# Patient Record
Sex: Female | Born: 1945
Health system: Southern US, Community
[De-identification: ages and names within clinical notes are randomized; demographics above are authoritative.]

## PROBLEM LIST (undated history)

## (undated) DIAGNOSIS — J302 Other seasonal allergic rhinitis: Secondary | ICD-10-CM

## (undated) DIAGNOSIS — F0390 Unspecified dementia without behavioral disturbance: Secondary | ICD-10-CM

## (undated) DIAGNOSIS — M1990 Unspecified osteoarthritis, unspecified site: Secondary | ICD-10-CM

## (undated) DIAGNOSIS — I10 Essential (primary) hypertension: Secondary | ICD-10-CM

## (undated) DIAGNOSIS — Z780 Asymptomatic menopausal state: Secondary | ICD-10-CM

## (undated) HISTORY — DX: Asymptomatic menopausal state: Z78.0

## (undated) HISTORY — PX: PILONIDAL CYST EXCISION: SHX744

---

## 1999-01-25 ENCOUNTER — Other Ambulatory Visit: Admission: RE | Admit: 1999-01-25 | Discharge: 1999-01-25 | Payer: Self-pay | Admitting: *Deleted

## 1999-05-31 ENCOUNTER — Other Ambulatory Visit: Admission: RE | Admit: 1999-05-31 | Discharge: 1999-05-31 | Payer: Self-pay | Admitting: *Deleted

## 1999-12-19 ENCOUNTER — Other Ambulatory Visit: Admission: RE | Admit: 1999-12-19 | Discharge: 1999-12-19 | Payer: Self-pay | Admitting: *Deleted

## 2000-05-15 ENCOUNTER — Other Ambulatory Visit: Admission: RE | Admit: 2000-05-15 | Discharge: 2000-05-15 | Payer: Self-pay | Admitting: *Deleted

## 2000-12-03 ENCOUNTER — Other Ambulatory Visit: Admission: RE | Admit: 2000-12-03 | Discharge: 2000-12-03 | Payer: Self-pay | Admitting: *Deleted

## 2001-08-19 ENCOUNTER — Other Ambulatory Visit: Admission: RE | Admit: 2001-08-19 | Discharge: 2001-08-19 | Payer: Self-pay | Admitting: *Deleted

## 2003-02-21 ENCOUNTER — Other Ambulatory Visit: Admission: RE | Admit: 2003-02-21 | Discharge: 2003-02-21 | Payer: Self-pay | Admitting: Obstetrics and Gynecology

## 2003-03-01 ENCOUNTER — Other Ambulatory Visit: Admission: RE | Admit: 2003-03-01 | Discharge: 2003-03-01 | Payer: Self-pay | Admitting: Obstetrics and Gynecology

## 2003-04-21 ENCOUNTER — Ambulatory Visit (HOSPITAL_COMMUNITY): Admission: RE | Admit: 2003-04-21 | Discharge: 2003-04-21 | Payer: Self-pay | Admitting: Obstetrics and Gynecology

## 2003-04-21 ENCOUNTER — Encounter (INDEPENDENT_AMBULATORY_CARE_PROVIDER_SITE_OTHER): Payer: Self-pay

## 2004-06-06 ENCOUNTER — Other Ambulatory Visit: Admission: RE | Admit: 2004-06-06 | Discharge: 2004-06-06 | Payer: Self-pay | Admitting: *Deleted

## 2005-01-23 ENCOUNTER — Encounter: Admission: RE | Admit: 2005-01-23 | Discharge: 2005-01-23 | Payer: Self-pay | Admitting: Family Medicine

## 2007-01-11 ENCOUNTER — Other Ambulatory Visit: Admission: RE | Admit: 2007-01-11 | Discharge: 2007-01-11 | Payer: Self-pay | Admitting: Obstetrics & Gynecology

## 2007-11-25 HISTORY — PX: BLEPHAROPLASTY: SUR158

## 2009-07-25 HISTORY — PX: JOINT REPLACEMENT: SHX530

## 2009-08-03 ENCOUNTER — Inpatient Hospital Stay (HOSPITAL_COMMUNITY): Admission: RE | Admit: 2009-08-03 | Discharge: 2009-08-06 | Payer: Self-pay | Admitting: Orthopedic Surgery

## 2010-09-30 LAB — HM DEXA SCAN

## 2011-02-28 LAB — CBC
HCT: 30.5 % — ABNORMAL LOW (ref 36.0–46.0)
HCT: 41.2 % (ref 36.0–46.0)
Hemoglobin: 10.2 g/dL — ABNORMAL LOW (ref 12.0–15.0)
Hemoglobin: 13.8 g/dL (ref 12.0–15.0)
Hemoglobin: 9.2 g/dL — ABNORMAL LOW (ref 12.0–15.0)
MCHC: 34.7 g/dL (ref 30.0–36.0)
MCV: 90.3 fL (ref 78.0–100.0)
MCV: 91.3 fL (ref 78.0–100.0)
Platelets: 133 10*3/uL — ABNORMAL LOW (ref 150–400)
Platelets: 139 10*3/uL — ABNORMAL LOW (ref 150–400)
RBC: 2.95 MIL/uL — ABNORMAL LOW (ref 3.87–5.11)
RBC: 3.1 MIL/uL — ABNORMAL LOW (ref 3.87–5.11)
RBC: 3.34 MIL/uL — ABNORMAL LOW (ref 3.87–5.11)
RDW: 13.8 % (ref 11.5–15.5)
WBC: 5.8 10*3/uL (ref 4.0–10.5)
WBC: 6.1 10*3/uL (ref 4.0–10.5)
WBC: 7.7 10*3/uL (ref 4.0–10.5)

## 2011-02-28 LAB — COMPREHENSIVE METABOLIC PANEL
ALT: 21 U/L (ref 0–35)
AST: 28 U/L (ref 0–37)
Alkaline Phosphatase: 52 U/L (ref 39–117)
BUN: 11 mg/dL (ref 6–23)
Calcium: 10 mg/dL (ref 8.4–10.5)
Chloride: 102 mEq/L (ref 96–112)
GFR calc Af Amer: >60 mL/min (ref >60)
GFR calc non Af Amer: >60 mL/min (ref >60)
Total Bilirubin: 0.4 mg/dL (ref 0.3–1.2)

## 2011-02-28 LAB — BASIC METABOLIC PANEL
BUN: 4 mg/dL — ABNORMAL LOW (ref 6–23)
Calcium: 8.4 mg/dL (ref 8.4–10.5)
Chloride: 103 mEq/L (ref 96–112)
Chloride: 104 mEq/L (ref 96–112)
GFR calc Af Amer: >60 mL/min (ref >60)
GFR calc Af Amer: >60 mL/min (ref >60)
GFR calc non Af Amer: >60 mL/min (ref >60)
Potassium: 3.9 mEq/L (ref 3.5–5.1)
Potassium: 3.9 mEq/L (ref 3.5–5.1)

## 2011-02-28 LAB — PROTIME-INR
INR: 0.9 (ref 0.00–1.49)
INR: 1.5 (ref 0.00–1.49)
Prothrombin Time: 19.6 seconds — ABNORMAL HIGH (ref 11.6–15.2)

## 2011-02-28 LAB — URINALYSIS, ROUTINE W REFLEX MICROSCOPIC
Bilirubin Urine: NEGATIVE
Hgb urine dipstick: NEGATIVE
Nitrite: NEGATIVE
Specific Gravity, Urine: 1.017 (ref 1.005–1.030)
Urobilinogen, UA: 0.2 mg/dL (ref 0.0–1.0)

## 2011-02-28 LAB — APTT: aPTT: 28 seconds (ref 24–37)

## 2011-02-28 LAB — TYPE AND SCREEN: Antibody Screen: NEGATIVE

## 2011-04-11 NOTE — Op Note (Signed)
NAME:  Kristina Carter, Kristina Carter                           ACCOUNT NO.:  [id number]   MEDICAL RECORD NO.:  [id number]                   PATIENT TYPE:  AMB   LOCATION:  SDC                                  FACILITY:  [facility]   PHYSICIAN:  Laqueta Linden, M.D.                 DATE OF BIRTH:  May 03, 1946   DATE OF PROCEDURE:  04/21/2003  DATE OF DISCHARGE:                                 OPERATIVE REPORT   PREOPERATIVE DIAGNOSIS:  Postmenopausal bleeding due to endometrial polyp.   POSTOPERATIVE DIAGNOSIS:  Postmenopausal bleeding due to endometrial polyp.   PROCEDURE:  Hysteroscopic resection with curettage.   SURGEON:  Laqueta Linden, M.D.   ANESTHESIA:  General LMA.   ESTIMATED BLOOD LOSS:  Less than 10 mL.   SORBITOL NET INTAKE:  Less than 100 mL.   COMPLICATIONS:  None.   INDICATIONS:  The patient is a 65 year old para 2 menopausal female who  presented with postmenopausal bleeding.  She underwent ultrasound with  sonohysterogram revealing a small fundal endometrial polyp as the source of  the bleeding.  She is therefore to undergo hysteroscopic evaluation with  resection.  She has seen the informed consent film, voiced her understanding  and acceptance of all risks, and agrees to proceed.   DESCRIPTION OF PROCEDURE:  The patient was taken to the operating room and  after proper identification and consents were ascertained, she was placed on  the operating table in the supine position.  After general LMA anesthesia  was induced, she was placed in the [place] stirrups and the perineum and  vagina were prepped and draped in a routine sterile fashion.  A  transurethral Foley was placed, which was removed at the conclusion of the  procedure.  Bimanual examination confirmed an anterior, mobile uterus.  A  speculum was placed in the vagina and the cervix was grasped with a single-  tooth tenaculum.  The uterine cavity sounded to 9 cm.  The internal os was  gently dilated to a #33 Pratt dilator.   The resectoscope with continuous  sorbitol infusion was then inserted.  Under direct vision using the video,  the endocervical canal was free of lesions.  The endometrial cavity appeared  atrophic and free of lesions with the exception of a 5-6 mm broad-based  polyp protruding from the left cornual region.  The right cornu was  visualized and there were no lesions.  The remainder of the cavity was  clean.  The resectoscope was placed on routine settings for the single loop,  and the polyp was then resected in several pieces.  Hemostasis was  excellent.  This specimen was sent separately.  There were no lesions above  where the polyp was in the cornual region.  The scope was then withdrawn.  Sharp curettage productive of a minimal amount of additional tissue was then  performed.  All instruments were then  removed.  The tenaculum site was  hemostatic.  There was no active bleeding from the cervix.  The patient  received Toradol 30 mg IV and 30 mg IM prior to awakening.  She was  awakened and stable on transfer to the recovery room.  She will be observed  and discharged per anesthesia protocol.  She is to follow up in the office  in two to three weeks' time or sooner for excessive pain, fever, bleeding,  or other concerns.  She is told to take Advil or Aleve as needed for any  cramping and to continue all her routine medications.                                               Laqueta Linden, M.D.    LKS/MEDQ  D:  04/21/2003  T:  04/21/2003  Job:  [id number]

## 2011-12-31 DIAGNOSIS — I10 Essential (primary) hypertension: Secondary | ICD-10-CM | POA: Diagnosis not present

## 2011-12-31 DIAGNOSIS — E7800 Pure hypercholesterolemia, unspecified: Secondary | ICD-10-CM | POA: Diagnosis not present

## 2012-08-24 DIAGNOSIS — M1710 Unilateral primary osteoarthritis, unspecified knee: Secondary | ICD-10-CM | POA: Diagnosis not present

## 2012-10-24 HISTORY — PX: BREAST SURGERY: SHX581

## 2012-11-05 DIAGNOSIS — Z1231 Encounter for screening mammogram for malignant neoplasm of breast: Secondary | ICD-10-CM | POA: Diagnosis not present

## 2012-11-10 DIAGNOSIS — N6489 Other specified disorders of breast: Secondary | ICD-10-CM | POA: Diagnosis not present

## 2012-11-10 DIAGNOSIS — N63 Unspecified lump in unspecified breast: Secondary | ICD-10-CM | POA: Diagnosis not present

## 2012-11-11 DIAGNOSIS — N6019 Diffuse cystic mastopathy of unspecified breast: Secondary | ICD-10-CM | POA: Diagnosis not present

## 2012-11-11 DIAGNOSIS — N63 Unspecified lump in unspecified breast: Secondary | ICD-10-CM | POA: Diagnosis not present

## 2012-11-11 DIAGNOSIS — Z0189 Encounter for other specified special examinations: Secondary | ICD-10-CM | POA: Diagnosis not present

## 2012-11-11 DIAGNOSIS — R928 Other abnormal and inconclusive findings on diagnostic imaging of breast: Secondary | ICD-10-CM | POA: Diagnosis not present

## 2012-12-01 DIAGNOSIS — Z23 Encounter for immunization: Secondary | ICD-10-CM | POA: Diagnosis not present

## 2012-12-24 DIAGNOSIS — M1710 Unilateral primary osteoarthritis, unspecified knee: Secondary | ICD-10-CM | POA: Diagnosis not present

## 2013-01-27 DIAGNOSIS — I10 Essential (primary) hypertension: Secondary | ICD-10-CM | POA: Diagnosis not present

## 2013-01-27 DIAGNOSIS — M1710 Unilateral primary osteoarthritis, unspecified knee: Secondary | ICD-10-CM | POA: Diagnosis not present

## 2013-02-02 DIAGNOSIS — M1710 Unilateral primary osteoarthritis, unspecified knee: Secondary | ICD-10-CM | POA: Diagnosis not present

## 2013-02-09 DIAGNOSIS — M1710 Unilateral primary osteoarthritis, unspecified knee: Secondary | ICD-10-CM | POA: Diagnosis not present

## 2013-03-10 DIAGNOSIS — M1710 Unilateral primary osteoarthritis, unspecified knee: Secondary | ICD-10-CM | POA: Diagnosis not present

## 2013-03-25 ENCOUNTER — Encounter (HOSPITAL_COMMUNITY): Payer: Self-pay | Admitting: Pharmacy Technician

## 2013-03-28 NOTE — Progress Notes (Signed)
Need orders when you can please - pt coming for preop Mon 04/04/13 thank you

## 2013-03-31 ENCOUNTER — Other Ambulatory Visit: Payer: Self-pay | Admitting: Orthopedic Surgery

## 2013-03-31 MED ORDER — BUPIVACAINE LIPOSOME 1.3 % IJ SUSP: 20.0000 mL | Freq: Once | INTRAMUSCULAR | Status: DC

## 2013-03-31 MED ORDER — DEXAMETHASONE SODIUM PHOSPHATE 10 MG/ML IJ SOLN: 10.0000 mg | Freq: Once | INTRAMUSCULAR | Status: DC

## 2013-03-31 NOTE — Progress Notes (Signed)
Preoperative surgical orders have been place into the Epic hospital system for Kristina Carter on 03/31/2013, 12:35 PM  by Patrica Duel for surgery on 04/11/2013.  Preop Total Knee orders including Experal, IV Tylenol, and IV Decadron as long as there are no contraindications to the above medications. Avel Peace, PA-C

## 2013-04-04 ENCOUNTER — Encounter (HOSPITAL_COMMUNITY)
Admission: RE | Admit: 2013-04-04 | Discharge: 2013-04-04 | Disposition: A | Discharge status: Home / Self Care | Payer: Medicare Other | Source: Ambulatory Visit | Attending: Orthopedic Surgery | Admitting: Orthopedic Surgery

## 2013-04-04 ENCOUNTER — Other Ambulatory Visit: Payer: Self-pay

## 2013-04-04 ENCOUNTER — Encounter (HOSPITAL_COMMUNITY): Payer: Self-pay

## 2013-04-04 ENCOUNTER — Ambulatory Visit (HOSPITAL_COMMUNITY)
Admission: RE | Admit: 2013-04-04 | Discharge: 2013-04-04 | Disposition: A | Discharge status: Home / Self Care | Payer: Medicare Other | Source: Ambulatory Visit | Attending: Orthopedic Surgery | Admitting: Orthopedic Surgery

## 2013-04-04 DIAGNOSIS — R9431 Abnormal electrocardiogram [ECG] [EKG]: Secondary | ICD-10-CM | POA: Diagnosis not present

## 2013-04-04 DIAGNOSIS — Z01810 Encounter for preprocedural cardiovascular examination: Secondary | ICD-10-CM | POA: Insufficient documentation

## 2013-04-04 DIAGNOSIS — Z01812 Encounter for preprocedural laboratory examination: Secondary | ICD-10-CM | POA: Diagnosis not present

## 2013-04-04 DIAGNOSIS — Z0183 Encounter for blood typing: Secondary | ICD-10-CM | POA: Insufficient documentation

## 2013-04-04 DIAGNOSIS — Z01818 Encounter for other preprocedural examination: Secondary | ICD-10-CM | POA: Diagnosis not present

## 2013-04-04 HISTORY — DX: Unspecified dementia, unspecified severity, without behavioral disturbance, psychotic disturbance, mood disturbance, and anxiety: F03.90

## 2013-04-04 HISTORY — DX: Essential (primary) hypertension: I10

## 2013-04-04 HISTORY — DX: Unspecified osteoarthritis, unspecified site: M19.90

## 2013-04-04 HISTORY — DX: Other seasonal allergic rhinitis: J30.2

## 2013-04-04 LAB — CBC
MCH: 28.3 pg (ref 26.0–34.0)
MCV: 87.1 fL (ref 78.0–100.0)
Platelets: 185 10*3/uL (ref 150–400)
RDW: 13.9 % (ref 11.5–15.5)
WBC: 6.7 10*3/uL (ref 4.0–10.5)

## 2013-04-04 LAB — URINALYSIS, ROUTINE W REFLEX MICROSCOPIC
Ketones, ur: NEGATIVE mg/dL
Nitrite: NEGATIVE
Protein, ur: NEGATIVE mg/dL
Urobilinogen, UA: 0.2 mg/dL (ref 0.0–1.0)

## 2013-04-04 LAB — PROTIME-INR: INR: 0.93 (ref 0.00–1.49)

## 2013-04-04 LAB — COMPREHENSIVE METABOLIC PANEL
AST: 19 U/L (ref 0–37)
Albumin: 3.7 g/dL (ref 3.5–5.2)
Calcium: 9.5 mg/dL (ref 8.4–10.5)
Chloride: 103 mEq/L (ref 96–112)
Creatinine, Ser: 0.59 mg/dL (ref 0.50–1.10)
Sodium: 140 mEq/L (ref 135–145)

## 2013-04-04 NOTE — Patient Instructions (Addendum)
20 Kristina Carter  04/04/2013   Your procedure is scheduled on: 5-19  -2014  Report to [facility] at    0730    AM.  Call this number if you have problems the morning of surgery: [phone]  Or Presurgical Testing [phone](Theophile Harvie)      Do not eat food:After Midnight.    Take these medicines the morning of surgery with A SIP OF WATER: Labetalol. Ceterizine. Stop Aspirin x5 days prior . Stop Ibuprofen, Naproxen x3 days prior   Do not wear jewelry, make-up or nail polish.  Do not wear lotions, powders, or perfumes. You may wear deodorant.  Do not shave 12 hours prior to first CHG shower(legs and under arms).(face and neck okay.)  Do not bring valuables to the hospital.  Contacts, dentures or bridgework,body piercing,  may not be worn into surgery.  Leave suitcase in the car. After surgery it may be brought to your room.  For patients admitted to the hospital, checkout time is 11:00 AM the day of discharge.   Patients discharged the day of surgery will not be allowed to drive home. Must have responsible person with you x 24 hours once discharged.  Name and phone number of your driver: Spouse Deniece Portela [phone] cell  Special Instructions: CHG(Chlorhedine 4%-"Hibiclens","Betasept","Aplicare") Shower Use Special Wash: see special instructions.(avoid face and genitals)   Please read over the following fact sheets that you were given: MRSA Information, Blood Transfusion fact sheet, Incentive Spirometry Instruction.    Failure to follow these instructions may result in Cancellation of your surgery.   Patient signature_______________________________________________________

## 2013-04-04 NOTE — Progress Notes (Signed)
04-04-13 1700 labs viewable in Epic-note urinalysis.

## 2013-04-04 NOTE — Pre-Procedure Instructions (Signed)
04-11-13 EKG/ CXR done today.

## 2013-04-10 ENCOUNTER — Other Ambulatory Visit: Payer: Self-pay | Admitting: Surgical

## 2013-04-10 NOTE — H&P (Signed)
TOTAL KNEE ADMISSION H&P  Patient is being admitted for right total knee arthroplasty.  Subjective:  Chief Complaint:right knee pain.  HPI: Kristina Carter, 67 y.o. female, has a history of pain and functional disability in the right knee due to arthritis and has failed non-surgical conservative treatments for greater than 12 weeks to includeNSAID's and/or analgesics, corticosteriod injections, viscosupplementation injections and activity modification.  Onset of symptoms was gradual, starting 5 years ago with gradually worsening course since that time. The patient noted no past surgery on the right knee(s).  Patient currently rates pain in the right knee(s) at 7 out of 10 with activity. Patient has night pain, worsening of pain with activity and weight bearing, pain that interferes with activities of daily living, pain with passive range of motion, crepitus and joint swelling.  Patient has evidence of periarticular osteophytes and joint space narrowing by imaging studies. There is no active infection.   Past Medical History  Diagnosis Date  . Hypertension   . Seasonal allergies   . Arthritis     osteoarthritis  . Dementia     Past Surgical History  Procedure Laterality Date  . Joint replacement      LTKA- 4 yrs ago  . Pilonidal cyst excision    . Blepharoplasty      bilateral  . Breast surgery      12'13 left breast -benign- biopsy     Current outpatient prescriptions:BIOTIN PO, Take 1 tablet by mouth daily., Disp: , Rfl: ;  cetirizine (ZYRTEC) 10 MG tablet, Take 10 mg by mouth daily., Disp: , Rfl: ;  ibuprofen (ADVIL,MOTRIN) 200 MG tablet, Take 600-800 mg by mouth every 6 (six) hours as needed for pain., Disp: , Rfl: ;  labetalol (NORMODYNE) 200 MG tablet, Take 200 mg by mouth 2 (two) times daily., Disp: , Rfl:  losartan-hydrochlorothiazide (HYZAAR) 50-12.5 MG per tablet, Take 1 tablet by mouth every morning., Disp: , Rfl: ;  MOMETASONE FUROATE EX, Place 1 drop into both ears 2 (two)  times daily as needed. Itchy ears, Disp: , Rfl: ;  Multiple Vitamin (MULTIVITAMIN WITH MINERALS) TABS, Take 1 tablet by mouth daily., Disp: , Rfl: ;  naproxen sodium (ANAPROX) 220 MG tablet, Take 220 mg by mouth 2 (two) times daily as needed (pain)., Disp: , Rfl:  Olopatadine HCl (PATADAY) 0.2 % SOLN, Apply 1 drop to eye daily as needed (dry eyes)., Disp: , Rfl: ;  Omega-3 Fatty Acids (FISH OIL) 1200 MG CAPS, Take 1,200 mg by mouth 2 (two) times daily., Disp: , Rfl: ;  psyllium (REGULOID) 0.52 G capsule, Take 1.04 g by mouth daily., Disp: , Rfl:   Allergies  Allergen Reactions  . Codeine Nausea And Vomiting and Other (See Comments)    "Makes crazy"    History  Substance Use Topics  . Smoking status: Never Smoker   . Smokeless tobacco: Not on file  . Alcohol Use: Yes     Comment: social    Family History Father deceased age 13 due to MI Mother deceased age 47 due to MI  Review of Systems  Constitutional: Negative.   HENT: Negative.  Negative for neck pain.   Eyes: Negative.   Respiratory: Negative.   Cardiovascular: Negative.   Gastrointestinal: Negative.   Genitourinary: Negative.   Musculoskeletal: Positive for joint pain. Negative for myalgias, back pain and falls.       Right knee pain  Skin: Negative.   Neurological: Negative.   Endo/Heme/Allergies: Negative.   Psychiatric/Behavioral: Negative.  Objective:  Physical Exam  Constitutional: She is oriented to person, place, and time. She appears well-developed and well-nourished. No distress.  HENT:  Head: Normocephalic and atraumatic.  Right Ear: External ear normal.  Left Ear: External ear normal.  Nose: Nose normal.  Mouth/Throat: Oropharynx is clear and moist.  Eyes: Conjunctivae and EOM are normal.  Neck: Normal range of motion. Neck supple. No tracheal deviation present. No thyromegaly present.  Cardiovascular: Normal rate, regular rhythm, normal heart sounds and intact distal pulses.   No murmur  heard. Respiratory: Effort normal. No respiratory distress. She has no wheezes. She exhibits no tenderness.  GI: Soft. Bowel sounds are normal. She exhibits no distension and no mass. There is no tenderness.  Musculoskeletal:       Right hip: Normal.       Left hip: Normal.       Right knee: She exhibits decreased range of motion and swelling. She exhibits no effusion and no erythema. Tenderness found. Medial joint line and lateral joint line tenderness noted.       Left knee: Normal.       Right lower leg: She exhibits no tenderness and no swelling.       Left lower leg: She exhibits no tenderness and no swelling.  Her right knee shows no effusion. Her range is about 10 to 120 with massive crepitus on range of motion. She is tender throughout the knee. There is no instability noted.  Lymphadenopathy:    She has no cervical adenopathy.  Neurological: She is alert and oriented to person, place, and time. She has normal strength and normal reflexes.  Skin: No rash noted. She is not diaphoretic. No erythema.  Psychiatric: She has a normal mood and affect. Her behavior is normal.    Vitals Weight: 176 lb Height: 63.5 in Body Surface Area: 1.89 m Body Mass Index: 30.69 kg/m Pulse: 68 (Regular) BP: 146/78 (Sitting, Left Arm, Standard)  Imaging Review Plain radiographs demonstrate severe degenerative joint disease of the right knee(s). The overall alignment ismild varus. The bone quality appears to be fair for age and reported activity level.  Assessment/Plan:  End stage arthritis, right knee   The patient history, physical examination, clinical judgment of the provider and imaging studies are consistent with end stage degenerative joint disease of the right knee(s) and total knee arthroplasty is deemed medically necessary. The treatment options including medical management, injection therapy arthroscopy and arthroplasty were discussed at length. The risks and benefits of  total knee arthroplasty were presented and reviewed. The risks due to aseptic loosening, infection, stiffness, patella tracking problems, thromboembolic complications and other imponderables were discussed. The patient acknowledged the explanation, agreed to proceed with the plan and consent was signed. Patient is being admitted for inpatient treatment for surgery, pain control, PT, OT, prophylactic antibiotics, VTE prophylaxis, progressive ambulation and ADL's and discharge planning. The patient is planning to be discharged home with home health services     [place], [place]

## 2013-04-11 ENCOUNTER — Encounter (HOSPITAL_COMMUNITY): Payer: Self-pay | Admitting: *Deleted

## 2013-04-11 ENCOUNTER — Inpatient Hospital Stay (HOSPITAL_COMMUNITY): Payer: Medicare Other | Admitting: Anesthesiology

## 2013-04-11 ENCOUNTER — Inpatient Hospital Stay (HOSPITAL_COMMUNITY)
Admission: RE | Admit: 2013-04-11 | Discharge: 2013-04-13 | DRG: 470 | Disposition: A | Discharge status: Home Health | Payer: Medicare Other | Source: Ambulatory Visit | Attending: Orthopedic Surgery | Admitting: Orthopedic Surgery

## 2013-04-11 ENCOUNTER — Encounter (HOSPITAL_COMMUNITY): Payer: Self-pay | Admitting: Anesthesiology

## 2013-04-11 ENCOUNTER — Encounter (HOSPITAL_COMMUNITY)
Admission: RE | Disposition: A | Discharge status: Home Health | Payer: Self-pay | Source: Ambulatory Visit | Attending: Orthopedic Surgery

## 2013-04-11 DIAGNOSIS — I10 Essential (primary) hypertension: Secondary | ICD-10-CM | POA: Diagnosis present

## 2013-04-11 DIAGNOSIS — Z791 Long term (current) use of non-steroidal anti-inflammatories (NSAID): Secondary | ICD-10-CM

## 2013-04-11 DIAGNOSIS — IMO0002 Reserved for concepts with insufficient information to code with codable children: Secondary | ICD-10-CM | POA: Diagnosis not present

## 2013-04-11 DIAGNOSIS — M1710 Unilateral primary osteoarthritis, unspecified knee: Secondary | ICD-10-CM | POA: Diagnosis present

## 2013-04-11 DIAGNOSIS — M25569 Pain in unspecified knee: Secondary | ICD-10-CM | POA: Diagnosis not present

## 2013-04-11 DIAGNOSIS — Z96651 Presence of right artificial knee joint: Secondary | ICD-10-CM

## 2013-04-11 DIAGNOSIS — F0390 Unspecified dementia without behavioral disturbance: Secondary | ICD-10-CM | POA: Diagnosis not present

## 2013-04-11 HISTORY — PX: TOTAL KNEE ARTHROPLASTY: SHX125

## 2013-04-11 LAB — TYPE AND SCREEN: ABO/RH(D): O POS

## 2013-04-11 SURGERY — ARTHROPLASTY, KNEE, TOTAL
Anesthesia: Spinal | Site: Knee | Laterality: Right | Wound class: Clean

## 2013-04-11 MED ORDER — LOSARTAN POTASSIUM-HCTZ 50-12.5 MG PO TABS: 1.0000 | ORAL_TABLET | Freq: Every morning | ORAL | Status: DC

## 2013-04-11 MED ORDER — KETAMINE HCL 10 MG/ML IJ SOLN
INTRAMUSCULAR | Status: DC | PRN
  Administered 2013-04-11: 20 mg via INTRAVENOUS

## 2013-04-11 MED ORDER — DEXAMETHASONE 4 MG PO TABS
10.0000 mg | ORAL_TABLET | Freq: Three times a day (TID) | ORAL | Status: AC
  Filled 2013-04-11: qty 1

## 2013-04-11 MED ORDER — BUPIVACAINE HCL (PF) 0.75 % IJ SOLN
INTRAMUSCULAR | Status: DC | PRN
  Administered 2013-04-11: 1.6 mL

## 2013-04-11 MED ORDER — METHOCARBAMOL 500 MG PO TABS
500.0000 mg | ORAL_TABLET | Freq: Four times a day (QID) | ORAL | Status: DC | PRN
  Administered 2013-04-11 – 2013-04-13 (×4): 500 mg via ORAL
  Filled 2013-04-11 (×4): qty 1

## 2013-04-11 MED ORDER — FENTANYL CITRATE 0.05 MG/ML IJ SOLN
INTRAMUSCULAR | Status: DC | PRN
  Administered 2013-04-11: 100 ug via INTRAVENOUS

## 2013-04-11 MED ORDER — METHOCARBAMOL 100 MG/ML IJ SOLN: 500.0000 mg | Freq: Four times a day (QID) | INTRAVENOUS | Status: DC | PRN

## 2013-04-11 MED ORDER — OLOPATADINE HCL 0.2 % OP SOLN: 1.0000 [drp] | Freq: Every day | OPHTHALMIC | Status: DC | PRN

## 2013-04-11 MED ORDER — MENTHOL 3 MG MT LOZG: 1.0000 | LOZENGE | OROMUCOSAL | Status: DC | PRN

## 2013-04-11 MED ORDER — TRANEXAMIC ACID 100 MG/ML IV SOLN
1000.0000 mg | INTRAVENOUS | Status: AC
  Administered 2013-04-11: 1000 mg via INTRAVENOUS
  Filled 2013-04-11: qty 10

## 2013-04-11 MED ORDER — METOCLOPRAMIDE HCL 10 MG PO TABS: 5.0000 mg | ORAL_TABLET | Freq: Three times a day (TID) | ORAL | Status: DC | PRN

## 2013-04-11 MED ORDER — HYDROMORPHONE HCL PF 1 MG/ML IJ SOLN
0.5000 mg | INTRAMUSCULAR | Status: DC | PRN
  Administered 2013-04-11 (×2): 1 mg via INTRAVENOUS
  Filled 2013-04-11 (×2): qty 1

## 2013-04-11 MED ORDER — SODIUM CHLORIDE 0.9 % IV SOLN: INTRAVENOUS | Status: DC

## 2013-04-11 MED ORDER — KETOROLAC TROMETHAMINE 15 MG/ML IJ SOLN: 15.0000 mg | Freq: Four times a day (QID) | INTRAMUSCULAR | Status: DC | PRN

## 2013-04-11 MED ORDER — LABETALOL HCL 200 MG PO TABS
200.0000 mg | ORAL_TABLET | Freq: Two times a day (BID) | ORAL | Status: DC
Start: 2013-04-11 — End: 2013-04-13
  Administered 2013-04-11 – 2013-04-13 (×4): 200 mg via ORAL
  Filled 2013-04-11 (×5): qty 1

## 2013-04-11 MED ORDER — LOSARTAN POTASSIUM 50 MG PO TABS
50.0000 mg | ORAL_TABLET | Freq: Every day | ORAL | Status: DC
  Administered 2013-04-11 – 2013-04-13 (×3): 50 mg via ORAL
  Filled 2013-04-11 (×3): qty 1

## 2013-04-11 MED ORDER — SODIUM CHLORIDE 0.9 % IJ SOLN
INTRAMUSCULAR | Status: DC | PRN
  Administered 2013-04-11: 11:00:00

## 2013-04-11 MED ORDER — PHENOL 1.4 % MT LIQD: 1.0000 | OROMUCOSAL | Status: DC | PRN

## 2013-04-11 MED ORDER — KETOROLAC TROMETHAMINE 30 MG/ML IJ SOLN: 15.0000 mg | Freq: Once | INTRAMUSCULAR | Status: DC | PRN

## 2013-04-11 MED ORDER — ACETAMINOPHEN 10 MG/ML IV SOLN
1000.0000 mg | Freq: Once | INTRAVENOUS | Status: AC
  Administered 2013-04-11: 1000 mg via INTRAVENOUS

## 2013-04-11 MED ORDER — PROPOFOL INFUSION 10 MG/ML OPTIME
INTRAVENOUS | Status: DC | PRN
  Administered 2013-04-11: 50 ug/kg/min via INTRAVENOUS

## 2013-04-11 MED ORDER — RIVAROXABAN 10 MG PO TABS
10.0000 mg | ORAL_TABLET | Freq: Every day | ORAL | Status: DC
  Administered 2013-04-12 – 2013-04-13 (×2): 10 mg via ORAL
  Filled 2013-04-11 (×3): qty 1

## 2013-04-11 MED ORDER — HYDROCHLOROTHIAZIDE 12.5 MG PO CAPS
12.5000 mg | ORAL_CAPSULE | Freq: Every day | ORAL | Status: DC
  Administered 2013-04-11 – 2013-04-13 (×3): 12.5 mg via ORAL
  Filled 2013-04-11 (×3): qty 1

## 2013-04-11 MED ORDER — CEFAZOLIN SODIUM 1-5 GM-% IV SOLN
1.0000 g | Freq: Four times a day (QID) | INTRAVENOUS | Status: AC
  Administered 2013-04-11 (×2): 1 g via INTRAVENOUS
  Filled 2013-04-11 (×2): qty 50

## 2013-04-11 MED ORDER — HYDROMORPHONE HCL 2 MG PO TABS
2.0000 mg | ORAL_TABLET | ORAL | Status: DC | PRN
  Administered 2013-04-11: 2 mg via ORAL
  Administered 2013-04-11 – 2013-04-13 (×9): 4 mg via ORAL
  Filled 2013-04-11 (×5): qty 2
  Filled 2013-04-11: qty 1
  Filled 2013-04-11 (×4): qty 2

## 2013-04-11 MED ORDER — ACETAMINOPHEN 10 MG/ML IV SOLN
1000.0000 mg | Freq: Four times a day (QID) | INTRAVENOUS | Status: AC
  Administered 2013-04-11 – 2013-04-12 (×4): 1000 mg via INTRAVENOUS
  Filled 2013-04-11 (×6): qty 100

## 2013-04-11 MED ORDER — ACETAMINOPHEN 650 MG RE SUPP: 650.0000 mg | Freq: Four times a day (QID) | RECTAL | Status: DC | PRN

## 2013-04-11 MED ORDER — LACTATED RINGERS IV SOLN
INTRAVENOUS | Status: DC
  Administered 2013-04-11 (×2): via INTRAVENOUS
  Administered 2013-04-11: 1000 mL via INTRAVENOUS

## 2013-04-11 MED ORDER — BUPIVACAINE HCL 0.25 % IJ SOLN
INTRAMUSCULAR | Status: DC | PRN
  Administered 2013-04-11: 20 mL

## 2013-04-11 MED ORDER — 0.9 % SODIUM CHLORIDE (POUR BTL) OPTIME
TOPICAL | Status: DC | PRN
  Administered 2013-04-11: 1000 mL

## 2013-04-11 MED ORDER — PROMETHAZINE HCL 25 MG/ML IJ SOLN: 6.2500 mg | INTRAMUSCULAR | Status: DC | PRN

## 2013-04-11 MED ORDER — MIDAZOLAM HCL 5 MG/5ML IJ SOLN
INTRAMUSCULAR | Status: DC | PRN
  Administered 2013-04-11: 2 mg via INTRAVENOUS

## 2013-04-11 MED ORDER — FLEET ENEMA 7-19 GM/118ML RE ENEM: 1.0000 | ENEMA | Freq: Once | RECTAL | Status: AC | PRN

## 2013-04-11 MED ORDER — DEXAMETHASONE SODIUM PHOSPHATE 10 MG/ML IJ SOLN
10.0000 mg | Freq: Three times a day (TID) | INTRAMUSCULAR | Status: AC
  Administered 2013-04-12: 10 mg via INTRAVENOUS
  Filled 2013-04-11: qty 1

## 2013-04-11 MED ORDER — BUPIVACAINE LIPOSOME 1.3 % IJ SUSP
20.0000 mL | Freq: Once | INTRAMUSCULAR | Status: DC
  Filled 2013-04-11: qty 20

## 2013-04-11 MED ORDER — ONDANSETRON HCL 4 MG PO TABS: 4.0000 mg | ORAL_TABLET | Freq: Four times a day (QID) | ORAL | Status: DC | PRN

## 2013-04-11 MED ORDER — FENTANYL CITRATE 0.05 MG/ML IJ SOLN: 25.0000 ug | INTRAMUSCULAR | Status: DC | PRN

## 2013-04-11 MED ORDER — POTASSIUM CHLORIDE IN NACL 20-0.9 MEQ/L-% IV SOLN
INTRAVENOUS | Status: DC
  Administered 2013-04-11 – 2013-04-12 (×3): via INTRAVENOUS
  Filled 2013-04-11 (×2): qty 1000

## 2013-04-11 MED ORDER — CHLORHEXIDINE GLUCONATE 4 % EX LIQD
60.0000 mL | Freq: Once | CUTANEOUS | Status: DC
  Filled 2013-04-11: qty 60

## 2013-04-11 MED ORDER — DOCUSATE SODIUM 100 MG PO CAPS
100.0000 mg | ORAL_CAPSULE | Freq: Two times a day (BID) | ORAL | Status: DC
  Administered 2013-04-11 – 2013-04-13 (×4): 100 mg via ORAL

## 2013-04-11 MED ORDER — DIPHENHYDRAMINE HCL 12.5 MG/5ML PO ELIX
12.5000 mg | ORAL_SOLUTION | ORAL | Status: DC | PRN
  Administered 2013-04-13: 12.5 mg via ORAL
  Filled 2013-04-11: qty 5

## 2013-04-11 MED ORDER — ACETAMINOPHEN 325 MG PO TABS: 650.0000 mg | ORAL_TABLET | Freq: Four times a day (QID) | ORAL | Status: DC | PRN

## 2013-04-11 MED ORDER — BISACODYL 10 MG RE SUPP: 10.0000 mg | Freq: Every day | RECTAL | Status: DC | PRN

## 2013-04-11 MED ORDER — TRAMADOL HCL 50 MG PO TABS: 50.0000 mg | ORAL_TABLET | Freq: Four times a day (QID) | ORAL | Status: DC | PRN

## 2013-04-11 MED ORDER — POLYETHYLENE GLYCOL 3350 17 G PO PACK: 17.0000 g | PACK | Freq: Every day | ORAL | Status: DC | PRN

## 2013-04-11 MED ORDER — CEFAZOLIN SODIUM-DEXTROSE 2-3 GM-% IV SOLR
2.0000 g | INTRAVENOUS | Status: AC
  Administered 2013-04-11: 2 g via INTRAVENOUS

## 2013-04-11 MED ORDER — SODIUM CHLORIDE 0.9 % IR SOLN
Status: DC | PRN
  Administered 2013-04-11: 1000 mL

## 2013-04-11 MED ORDER — METOCLOPRAMIDE HCL 5 MG/ML IJ SOLN: 5.0000 mg | Freq: Three times a day (TID) | INTRAMUSCULAR | Status: DC | PRN

## 2013-04-11 MED ORDER — ONDANSETRON HCL 4 MG/2ML IJ SOLN
4.0000 mg | Freq: Four times a day (QID) | INTRAMUSCULAR | Status: DC | PRN
  Administered 2013-04-12: 4 mg via INTRAVENOUS
  Filled 2013-04-11: qty 2

## 2013-04-11 SURGICAL SUPPLY — 52 items
BANDAGE ELASTIC 6 VELCRO ST LF (GAUZE/BANDAGES/DRESSINGS) ×2 IMPLANT
BANDAGE ESMARK 6X9 LF (GAUZE/BANDAGES/DRESSINGS) ×1 IMPLANT
BLADE SAG 18X100X1.27 (BLADE) ×2 IMPLANT
BLADE SAW SGTL 11.0X1.19X90.0M (BLADE) ×2 IMPLANT
BNDG ESMARK 6X9 LF (GAUZE/BANDAGES/DRESSINGS) ×2
BOWL SMART MIX CTS (DISPOSABLE) ×2 IMPLANT
CEMENT HV SMART SET (Cement) ×4 IMPLANT
CLOTH BEACON ORANGE TIMEOUT ST (SAFETY) ×2 IMPLANT
CUFF TOURN SGL QUICK 34 (TOURNIQUET CUFF) ×1
CUFF TRNQT CYL 34X4X40X1 (TOURNIQUET CUFF) ×1 IMPLANT
DECANTER SPIKE VIAL GLASS SM (MISCELLANEOUS) ×2 IMPLANT
DRAPE EXTREMITY T 121X128X90 (DRAPE) ×2 IMPLANT
DRAPE POUCH INSTRU U-SHP 10X18 (DRAPES) ×2 IMPLANT
DRAPE U-SHAPE 47X51 STRL (DRAPES) ×2 IMPLANT
DRSG ADAPTIC 3X8 NADH LF (GAUZE/BANDAGES/DRESSINGS) ×2 IMPLANT
DRSG PAD ABDOMINAL 8X10 ST (GAUZE/BANDAGES/DRESSINGS) ×2 IMPLANT
DURAPREP 26ML APPLICATOR (WOUND CARE) ×2 IMPLANT
ELECT REM PT RETURN 9FT ADLT (ELECTROSURGICAL) ×2
ELECTRODE REM PT RTRN 9FT ADLT (ELECTROSURGICAL) ×1 IMPLANT
EVACUATOR 1/8 PVC DRAIN (DRAIN) ×2 IMPLANT
FACESHIELD LNG OPTICON STERILE (SAFETY) ×10 IMPLANT
GLOVE BIO SURGEON STRL SZ7.5 (GLOVE) ×2 IMPLANT
GLOVE BIO SURGEON STRL SZ8 (GLOVE) ×2 IMPLANT
GLOVE BIOGEL PI IND STRL 8 (GLOVE) ×2 IMPLANT
GLOVE BIOGEL PI INDICATOR 8 (GLOVE) ×2
GOWN STRL NON-REIN LRG LVL3 (GOWN DISPOSABLE) ×2 IMPLANT
GOWN STRL REIN XL XLG (GOWN DISPOSABLE) ×4 IMPLANT
HANDPIECE INTERPULSE COAX TIP (DISPOSABLE) ×1
IMMOBILIZER KNEE 20 (SOFTGOODS) ×2
IMMOBILIZER KNEE 20 THIGH 36 (SOFTGOODS) ×1 IMPLANT
KIT BASIN OR (CUSTOM PROCEDURE TRAY) ×2 IMPLANT
MANIFOLD NEPTUNE II (INSTRUMENTS) ×2 IMPLANT
NDL SAFETY ECLIPSE 18X1.5 (NEEDLE) ×2 IMPLANT
NEEDLE HYPO 18GX1.5 SHARP (NEEDLE) ×2
NS IRRIG 1000ML POUR BTL (IV SOLUTION) ×2 IMPLANT
PACK TOTAL JOINT (CUSTOM PROCEDURE TRAY) ×2 IMPLANT
PADDING CAST COTTON 6X4 STRL (CAST SUPPLIES) ×6 IMPLANT
POSITIONER SURGICAL ARM (MISCELLANEOUS) ×2 IMPLANT
SET HNDPC FAN SPRY TIP SCT (DISPOSABLE) ×1 IMPLANT
SPONGE GAUZE 4X4 12PLY (GAUZE/BANDAGES/DRESSINGS) ×2 IMPLANT
STRIP CLOSURE SKIN 1/2X4 (GAUZE/BANDAGES/DRESSINGS) ×4 IMPLANT
SUCTION FRAZIER 12FR DISP (SUCTIONS) ×2 IMPLANT
SUT MNCRL AB 4-0 PS2 18 (SUTURE) ×2 IMPLANT
SUT VIC AB 2-0 CT1 27 (SUTURE) ×3
SUT VIC AB 2-0 CT1 TAPERPNT 27 (SUTURE) ×3 IMPLANT
SUT VLOC 180 0 24IN GS25 (SUTURE) ×2 IMPLANT
SYR 20CC LL (SYRINGE) ×2 IMPLANT
SYR 50ML LL SCALE MARK (SYRINGE) ×2 IMPLANT
TOWEL OR 17X26 10 PK STRL BLUE (TOWEL DISPOSABLE) ×4 IMPLANT
TRAY FOLEY CATH 14FRSI W/METER (CATHETERS) ×2 IMPLANT
WATER STERILE IRR 1500ML POUR (IV SOLUTION) ×4 IMPLANT
WRAP KNEE MAXI GEL POST OP (GAUZE/BANDAGES/DRESSINGS) ×2 IMPLANT

## 2013-04-11 NOTE — Interval H&P Note (Signed)
History and Physical Interval Note:  04/11/2013 9:15 AM  Kristina Carter  has presented today for surgery, with the diagnosis of osteoarthritis right knee  The various methods of treatment have been discussed with the patient and family. After consideration of risks, benefits and other options for treatment, the patient has consented to  Procedure(s): RIGHT TOTAL KNEE ARTHROPLASTY (Right) as a surgical intervention .  The patient's history has been reviewed, patient examined, no change in status, stable for surgery.  I have reviewed the patient's chart and labs.  Questions were answered to the patient's satisfaction.     Loanne Drilling

## 2013-04-11 NOTE — Anesthesia Postprocedure Evaluation (Signed)
  Anesthesia Post-op Note  Patient: Kristina Carter  Procedure(s) Performed: Procedure(s) (LRB): RIGHT TOTAL KNEE ARTHROPLASTY (Right)  Patient Location: PACU  Anesthesia Type: Spinal  Level of Consciousness: awake and alert   Airway and Oxygen Therapy: Patient Spontanous Breathing  Post-op Pain: mild  Post-op Assessment: Post-op Vital signs reviewed, Patient's Cardiovascular Status Stable, Respiratory Function Stable, Patent Airway and No signs of Nausea or vomiting  Last Vitals:  Filed Vitals:   04/11/13 1230  BP: 160/66  Pulse: 54  Temp:   Resp: 17    Post-op Vital Signs: stable   Complications: No apparent anesthesia complications

## 2013-04-11 NOTE — Op Note (Signed)
Pre-operative diagnosis- Osteoarthritis  Right knee(s)  Post-operative diagnosis- Osteoarthritis Right knee(s)  Procedure-  Right  Total Knee Arthroplasty  Surgeon- Gus Rankin. Trever Streater, MD  Assistant- Avel Peace, PA-C   Anesthesia-  Spinal EBL-* No blood loss amount entered *  Drains Hemovac  Tourniquet time-  Total Tourniquet Time Documented: Thigh (Right) - 30 minutes Total: Thigh (Right) - 30 minutes    Complications- None  Condition-PACU - hemodynamically stable.   Brief Clinical Note  Kristina Carter is a 67 y.o. year old female with end stage OA of her right knee with progressively worsening pain and dysfunction. She has constant pain, with activity and at rest and significant functional deficits with difficulties even with ADLs. She has had extensive non-op management including analgesics, injections of cortisone and viscosupplements, and home exercise program, but remains in significant pain with significant dysfunction.Radiographs show bone on bone arthritis medial and patellofemoral with varus deformity. She presents now for right Total Knee Arthroplasty.    Procedure in detail---   The patient is brought into the operating room and positioned supine on the operating table. After successful administration of  Spinal,   a tourniquet is placed high on the  Right thigh(s) and the lower extremity is prepped and draped in the usual sterile fashion. Time out is performed by the operating team and then the  Right lower extremity is wrapped in Esmarch, knee flexed and the tourniquet inflated to 300 mmHg.       A midline incision is made with a ten blade through the subcutaneous tissue to the level of the extensor mechanism. A fresh blade is used to make a medial parapatellar arthrotomy. Soft tissue over the proximal medial tibia is subperiosteally elevated to the joint line with a knife and into the semimembranosus bursa with a Cobb elevator. Soft tissue over the proximal lateral tibia  is elevated with attention being paid to avoiding the patellar tendon on the tibial tubercle. The patella is everted, knee flexed 90 degrees and the ACL and PCL are removed. Findings are bone on bone medial and patellofemoral with large patellar and medial osteophytes.        The drill is used to create a starting hole in the distal femur and the canal is thoroughly irrigated with sterile saline to remove the fatty contents. The 5 degree Right  valgus alignment guide is placed into the femoral canal and the distal femoral cutting block is pinned to remove 10 mm off the distal femur. Resection is made with an oscillating saw.      The tibia is subluxed forward and the menisci are removed. The extramedullary alignment guide is placed referencing proximally at the medial aspect of the tibial tubercle and distally along the second metatarsal axis and tibial crest. The block is pinned to remove 2mm off the more deficient medial  side. Resection is made with an oscillating saw. Size 2.5is the most appropriate size for the tibia and the proximal tibia is prepared with the modular drill and keel punch for that size.      The femoral sizing guide is placed and size 3 is most appropriate. Rotation is marked off the epicondylar axis and confirmed by creating a rectangular flexion gap at 90 degrees. The size 3 cutting block is pinned in this rotation and the anterior, posterior and chamfer cuts are made with the oscillating saw. The intercondylar block is then placed and that cut is made.      Trial size 2.5 tibial  component, trial size 3 posterior stabilized femur and a 12.5  mm posterior stabilized rotating platform insert trial is placed. Full extension is achieved with excellent varus/valgus and anterior/posterior balance throughout full range of motion. The patella is everted and thickness measured to be 22  mm. Free hand resection is taken to 12 mm, a 35 template is placed, lug holes are drilled, trial patella is  placed, and it tracks normally. Osteophytes are removed off the posterior femur with the trial in place. All trials are removed and the cut bone surfaces prepared with pulsatile lavage. Cement is mixed and once ready for implantation, the size 2.5 tibial implant, size  3 posterior stabilized femoral component, and the size 35 patella are cemented in place and the patella is held with the clamp. The trial insert is placed and the knee held in full extension. The Exparel (20 ml mixed with 30 ml saline) and .25% Bupivicaine, are injected into the extensor mechanism, posterior capsule, medial and lateral gutters and subcutaneous tissues.  All extruded cement is removed and once the cement is hard the permanent 12.5 mm posterior stabilized rotating platform insert is placed into the tibial tray.      The wound is copiously irrigated with saline solution and the extensor mechanism closed over a hemovac drain with #1 PDS suture. The tourniquet is released for a total tourniquet time of 30  minutes. Flexion against gravity is 140 degrees and the patella tracks normally. Subcutaneous tissue is closed with 2.0 vicryl and subcuticular with running 4.0 Monocryl. The incision is cleaned and dried and steri-strips and a bulky sterile dressing are applied. The limb is placed into a knee immobilizer and the patient is awakened and transported to recovery in stable condition.      Please note that a surgical assistant was a medical necessity for this procedure in order to perform it in a safe and expeditious manner. Surgical assistant was necessary to retract the ligaments and vital neurovascular structures to prevent injury to them and also necessary for proper positioning of the limb to allow for anatomic placement of the prosthesis.   Gus Rankin [name], MD    04/11/2013, 11:18 AM

## 2013-04-11 NOTE — Transfer of Care (Signed)
Immediate Anesthesia Transfer of Care Note  Patient: Kristina Carter  Procedure(s) Performed: Procedure(s): RIGHT TOTAL KNEE ARTHROPLASTY (Right)  Patient Location: PACU  Anesthesia Type:Spinal  Level of Consciousness: sedated  Airway & Oxygen Therapy: Patient Spontanous Breathing and Patient connected to face mask oxygen  Post-op Assessment: Report given to PACU RN and Post -op Vital signs reviewed and stable  Post vital signs: Reviewed and stable  Complications: No apparent anesthesia complications

## 2013-04-11 NOTE — Anesthesia Procedure Notes (Signed)

## 2013-04-11 NOTE — Anesthesia Preprocedure Evaluation (Signed)
Anesthesia Evaluation  Patient identified by MRN, date of birth, ID band Patient awake    Reviewed: Allergy & Precautions, H&P , NPO status , Patient's Chart, lab work & pertinent test results  Airway Mallampati: II TM Distance: <3 FB Neck ROM: Full    Dental no notable dental hx.    Pulmonary neg pulmonary ROS,  breath sounds clear to auscultation  Pulmonary exam normal       Cardiovascular hypertension, Pt. on medications negative cardio ROS  Rhythm:Regular Rate:Normal     Neuro/Psych negative neurological ROS  negative psych ROS   GI/Hepatic negative GI ROS, Neg liver ROS,   Endo/Other  negative endocrine ROS  Renal/GU negative Renal ROS  negative genitourinary   Musculoskeletal negative musculoskeletal ROS (+)   Abdominal   Peds negative pediatric ROS (+)  Hematology negative hematology ROS (+)   Anesthesia Other Findings   Reproductive/Obstetrics negative OB ROS                           Anesthesia Physical Anesthesia Plan  ASA: II  Anesthesia Plan: Spinal   Post-op Pain Management:    Induction:   Airway Management Planned: Simple Face Mask  Additional Equipment:   Intra-op Plan:   Post-operative Plan:   Informed Consent: I have reviewed the patients History and Physical, chart, labs and discussed the procedure including the risks, benefits and alternatives for the proposed anesthesia with the patient or authorized representative who has indicated his/her understanding and acceptance.     Plan Discussed with: CRNA and Surgeon  Anesthesia Plan Comments:         Anesthesia Quick Evaluation

## 2013-04-11 NOTE — Plan of Care (Signed)
Problem: Consults Goal: Diagnosis- Total Joint Replacement Right total knee     

## 2013-04-12 LAB — CBC
Hemoglobin: 11.1 g/dL — ABNORMAL LOW (ref 12.0–15.0)
MCH: 28.2 pg (ref 26.0–34.0)
MCV: 88.1 fL (ref 78.0–100.0)
Platelets: 148 10*3/uL — ABNORMAL LOW (ref 150–400)
RBC: 3.94 MIL/uL (ref 3.87–5.11)
WBC: 7.2 10*3/uL (ref 4.0–10.5)

## 2013-04-12 LAB — BASIC METABOLIC PANEL
CO2: 29 mEq/L (ref 19–32)
Chloride: 102 mEq/L (ref 96–112)
Glucose, Bld: 120 mg/dL — ABNORMAL HIGH (ref 70–99)
Sodium: 137 mEq/L (ref 135–145)

## 2013-04-12 NOTE — Progress Notes (Signed)
Received order for commode.  Will be delivered to hospital room prior to d/c.

## 2013-04-12 NOTE — Progress Notes (Signed)
Physical Therapy Treatment Patient Details Name: Kristina Carter MRN: [id number] DOB: 1946/05/31 Today's Date: 04/12/2013 Time: 6578-4696 PT Time Calculation (min): 23 min  PT Assessment / Plan / Recommendation Comments on Treatment Session  Pt progressing much better with mobility this afternoon and with less pain.      Follow Up Recommendations  Home health PT;Supervision for mobility/OOB     Does the patient have the potential to tolerate intense rehabilitation     Barriers to Discharge        Equipment Recommendations  None recommended by PT    Recommendations for Other Services    Frequency 7X/week   Plan Discharge plan remains appropriate    Precautions / Restrictions Precautions Precautions: Knee Required Braces or Orthoses: Knee Immobilizer - Right Knee Immobilizer - Right: Discontinue once straight leg raise with < 10 degree lag Restrictions Weight Bearing Restrictions: No Other Position/Activity Restrictions: WBAT   Pertinent Vitals/Pain 5/10 pain, ice packs applied    Mobility  Bed Mobility Bed Mobility: Sit to Supine Sit to Supine: 4: Min assist;HOB flat Details for Bed Mobility Assistance: Assist for RLE into bed with min cues for adjusting hips once in bed.  Transfers Transfers: Sit to Stand;Stand to Sit Sit to Stand: 4: Min assist;With armrests;From chair/3-in-1 Stand to Sit: 4: Min assist;With upper extremity assist;To bed Details for Transfer Assistance: Cues for hand placement and LE management.  Ambulation/Gait Ambulation/Gait Assistance: 4: Min guard;4: Min Environmental consultant (Feet): 80 Feet Assistive device: Rolling walker Ambulation/Gait Assistance Details: Min cues for sequencing/technique with RW and to maintain upright posture throughout.  Gait Pattern: Step-to pattern;Antalgic;Trunk flexed Gait velocity: decreased    Exercises Total Joint Exercises Ankle Circles/Pumps: AROM;Both;20 reps Quad Sets: AROM;Right;10 reps Heel  Slides: AAROM;Right;10 reps Hip ABduction/ADduction: AAROM;Right;10 reps Straight Leg Raises: AAROM;Right;10 reps Goniometric ROM: approx 40 deg knee flex   PT Diagnosis:    PT Problem List:   PT Treatment Interventions:     PT Goals Acute Rehab PT Goals PT Goal Formulation: With patient Time For Goal Achievement: 04/15/13 Potential to Achieve Goals: Good Pt will go Sit to Supine/Side: with supervision PT Goal: Sit to Supine/Side - Progress: Progressing toward goal Pt will go Sit to Stand: with supervision PT Goal: Sit to Stand - Progress: Progressing toward goal Pt will go Stand to Sit: with supervision PT Goal: Stand to Sit - Progress: Progressing toward goal Pt will Ambulate: 51 - 150 feet;with supervision;with least restrictive assistive device PT Goal: Ambulate - Progress: Progressing toward goal Pt will Perform Home Exercise Program: with supervision, verbal cues required/provided PT Goal: Perform Home Exercise Program - Progress: Progressing toward goal  Visit Information  Last PT Received On: 04/12/13 Assistance Needed: +1    Subjective Data  Subjective: This is much better than this morning.  Patient Stated Goal: to return home.    Cognition  Cognition Arousal/Alertness: Awake/alert Behavior During Therapy: WFL for tasks assessed/performed Overall Cognitive Status: Within Functional Limits for tasks assessed    Balance     End of Session PT - End of Session Equipment Utilized During Treatment: Gait belt Activity Tolerance: Patient tolerated treatment well Patient left: in bed;with call bell/phone within reach;with family/visitor present Nurse Communication: Mobility status CPM Right Knee CPM Right Knee: On   GP     Vista Deck 04/12/2013, 5:45 PM

## 2013-04-12 NOTE — Evaluation (Signed)
Physical Therapy Evaluation Patient Details Name: SUMI LYE MRN: [id number] DOB: 1946-04-06 Today's Date: 04/12/2013 Time: 4098-1191 PT Time Calculation (min): 28 min  PT Assessment / Plan / Recommendation Clinical Impression  Pt presents s/p R TKA POD 1 with decreased strength, ROM and mobility.  Tolerated OOB to/from restroom well with RW, however did have increased pain.  She also mentioned being "hot" after returning from restroom, therefore did not ambulate further into hallway.  Pt will benefit from skilled PT in acute venue to address deficits.  PT recommends HHPT for follow up at D/C to maximize pts safety and function.     PT Assessment  Patient needs continued PT services    Follow Up Recommendations  Home health PT;Supervision for mobility/OOB    Does the patient have the potential to tolerate intense rehabilitation      Barriers to Discharge None      Equipment Recommendations  None recommended by PT    Recommendations for Other Services OT consult   Frequency 7X/week    Precautions / Restrictions Precautions Precautions: Knee Required Braces or Orthoses: Knee Immobilizer - Right Knee Immobilizer - Right: Discontinue once straight leg raise with < 10 degree lag Restrictions Weight Bearing Restrictions: No Other Position/Activity Restrictions: WBAT   Pertinent Vitals/Pain 7/10 pain, RN made aware, ice packs applied      Mobility  Bed Mobility Bed Mobility: Supine to Sit Supine to Sit: 4: Min assist;HOB elevated Details for Bed Mobility Assistance: Assist for RLE out of bed with min cues for hand placement and technique.  Transfers Transfers: Sit to Stand;Stand to Sit Sit to Stand: 4: Min assist;3: Mod assist;With upper extremity assist;From bed Stand to Sit: 4: Min assist;With upper extremity assist;With armrests;To chair/3-in-1 Details for Transfer Assistance: Performed x 2 in order to use 3in1 in restroom with cues for hand placement and LE  management.  Ambulation/Gait Ambulation/Gait Assistance: 4: Min assist Ambulation Distance (Feet): 15 Feet (then another 18) Assistive device: Rolling walker Ambulation/Gait Assistance Details: Cues for sequencing/technique with RW and to maintain upright posture throughout.  Note that pt tends to step too far inside of RW and with increased step lengths.   Gait Pattern: Step-to pattern;Antalgic;Trunk flexed Gait velocity: decreased Stairs: No Wheelchair Mobility Wheelchair Mobility: No    Exercises     PT Diagnosis: Difficulty walking;Generalized weakness;Acute pain  PT Problem List: Decreased strength;Decreased range of motion;Decreased activity tolerance;Decreased balance;Decreased mobility;Decreased coordination;Decreased knowledge of use of DME;Decreased safety awareness;Decreased knowledge of precautions;Pain PT Treatment Interventions: DME instruction;Gait training;Stair training;Functional mobility training;Therapeutic activities;Therapeutic exercise;Balance training;Patient/family education   PT Goals Acute Rehab PT Goals PT Goal Formulation: With patient Time For Goal Achievement: 04/15/13 Potential to Achieve Goals: Good Pt will go Supine/Side to Sit: with supervision PT Goal: Supine/Side to Sit - Progress: Goal set today Pt will go Sit to Supine/Side: with supervision PT Goal: Sit to Supine/Side - Progress: Goal set today Pt will go Sit to Stand: with supervision PT Goal: Sit to Stand - Progress: Goal set today Pt will go Stand to Sit: with supervision PT Goal: Stand to Sit - Progress: Goal set today Pt will Ambulate: 51 - 150 feet;with supervision;with least restrictive assistive device PT Goal: Ambulate - Progress: Goal set today Pt will Go Up / Down Stairs: 1-2 stairs;with min assist;with least restrictive assistive device PT Goal: Up/Down Stairs - Progress: Goal set today Pt will Perform Home Exercise Program: with supervision, verbal cues required/provided PT  Goal: Perform Home Exercise Program - Progress: Goal  set today  Visit Information  Last PT Received On: 04/12/13 Assistance Needed: +1    Subjective Data  Subjective: This hurts so much Patient Stated Goal: to return home.    Prior Functioning  Home Living Lives With: Spouse Available Help at Discharge: Family;Available 24 hours/day Type of Home: House Home Access: Stairs to enter [employer] of Steps: 1 Entrance Stairs-Rails: None Home Layout: Two level;Bed/bath upstairs;Able to live on main level with bedroom/bathroom Bathroom Shower/Tub: Walk-in shower;Tub/shower unit Home Adaptive Equipment: Bedside commode/3-in-1;Walker - rolling;Straight cane Prior Function Level of Independence: Independent Able to Take Stairs?: Yes Driving: Yes Vocation: Retired    Copywriter, advertising Arousal/Alertness: Awake/alert Behavior During Therapy: WFL for tasks assessed/performed Overall Cognitive Status: Within Functional Limits for tasks assessed    Extremity/Trunk Assessment Right Lower Extremity Assessment RLE ROM/Strength/Tone: Deficits RLE ROM/Strength/Tone Deficits: ankle motions WFL, unable to perform SLR without assist.  RLE Sensation: WFL - Light Touch Left Lower Extremity Assessment LLE ROM/Strength/Tone: WFL for tasks assessed LLE Sensation: WFL - Light Touch Trunk Assessment Trunk Assessment: Normal   Balance    End of Session PT - End of Session Equipment Utilized During Treatment: Gait belt Activity Tolerance: Patient limited by pain Patient left: in chair;with call bell/phone within reach;with family/visitor present Nurse Communication: Mobility status  GP     Vista Deck 04/12/2013, 12:14 PM

## 2013-04-12 NOTE — Progress Notes (Signed)
   Subjective: 1 Day Post-Op Procedure(s) (LRB): RIGHT TOTAL KNEE ARTHROPLASTY (Right) Patient reports pain as moderate last night.  Better this morning. Patient seen in rounds with Dr. Lequita Halt. Patient is well, but has had some minor complaints of pain in the knee, requiring pain medications We will start therapy today.  Plan is to go Home after hospital stay.  Objective: Vital signs in last 24 hours: Temp:  [97.6 F (36.4 C)-98.8 F (37.1 C)] 98.5 F (36.9 C) (05/20 0519) Pulse Rate:  [53-68] 53 (05/20 0519) Resp:  [14-18] 16 (05/20 0519) BP: (113-197)/(57-87) 113/68 mmHg (05/20 0519) SpO2:  [96 %-100 %] 96 % (05/20 0519) Weight:  [78.926 kg (174 lb)] 78.926 kg (174 lb) (05/19 1300)  Intake/Output from previous day:  Intake/Output Summary (Last 24 hours) at 04/12/13 0810 Last data filed at 04/12/13 0645  Gross per 24 hour  Intake   5231 ml  Output   3175 ml  Net   2056 ml     Labs:  Recent Labs  04/12/13 0424  HGB 11.1*    Recent Labs  04/12/13 0424  WBC 7.2  RBC 3.94  HCT 34.7*  PLT 148*    Recent Labs  04/12/13 0424  NA 137  K 3.5  CL 102  CO2 29  BUN 11  CREATININE 0.51  GLUCOSE 120*  CALCIUM 8.6   No results found for this basename: LABPT, INR,  in the last 72 hours  EXAM General - Patient is Alert, Appropriate and Oriented Extremity - Neurovascular intact Sensation intact distally Dorsiflexion/Plantar flexion intact Dressing - dressing C/D/I Motor Function - intact, moving foot and toes well on exam.  Hemovac pulled without difficulty.  Past Medical History  Diagnosis Date  . Hypertension   . Seasonal allergies   . Arthritis     osteoarthritis  . Dementia     Assessment/Plan: 1 Day Post-Op Procedure(s) (LRB): RIGHT TOTAL KNEE ARTHROPLASTY (Right) Principal Problem:   OA (osteoarthritis) of knee  Estimated body mass index is 30.34 kg/(m^2) as calculated from the following:   Height as of this encounter: 5' 3.5" (1.613 m).   Weight as of this encounter: 78.926 kg (174 lb). Advance diet Up with therapy Plan for discharge tomorrow Discharge home with home health  DVT Prophylaxis - Xarelto Weight-Bearing as tolerated to right leg No vaccines. D/C O2 and Pulse OX and try on Room Air  Kristina Carter 04/12/2013, 8:10 AM

## 2013-04-13 LAB — BASIC METABOLIC PANEL
BUN: 13 mg/dL (ref 6–23)
CO2: 30 mEq/L (ref 19–32)
Calcium: 8.9 mg/dL (ref 8.4–10.5)
Chloride: 101 mEq/L (ref 96–112)
Creatinine, Ser: 0.57 mg/dL (ref 0.50–1.10)
Glucose, Bld: 117 mg/dL — ABNORMAL HIGH (ref 70–99)

## 2013-04-13 LAB — CBC
HCT: 32.1 % — ABNORMAL LOW (ref 36.0–46.0)
MCH: 28.2 pg (ref 26.0–34.0)
MCV: 87.9 fL (ref 78.0–100.0)
RBC: 3.65 MIL/uL — ABNORMAL LOW (ref 3.87–5.11)
WBC: 11.4 10*3/uL — ABNORMAL HIGH (ref 4.0–10.5)

## 2013-04-13 MED ORDER — TRAMADOL HCL 50 MG PO TABS: 50.0000 mg | ORAL_TABLET | Freq: Four times a day (QID) | ORAL | Status: DC | PRN

## 2013-04-13 MED ORDER — METHOCARBAMOL 500 MG PO TABS: 500.0000 mg | ORAL_TABLET | Freq: Four times a day (QID) | ORAL | Status: AC | PRN

## 2013-04-13 MED ORDER — RIVAROXABAN 10 MG PO TABS: 10.0000 mg | ORAL_TABLET | Freq: Every day | ORAL | Status: DC

## 2013-04-13 MED ORDER — HYDROMORPHONE HCL 2 MG PO TABS: 2.0000 mg | ORAL_TABLET | ORAL | Status: DC | PRN

## 2013-04-13 NOTE — Progress Notes (Signed)
OT Cancellation Note  Patient Details Name: Kristina Carter MRN: [id number] DOB: 1946/07/20   Cancelled Treatment:    Reason Eval/Treat Not Completed:  No OT needs. Brief conversation with pt about ADL.  Will have availability of husband to assist as needed.  All DME needs are met from previous TKA.  Signing off.  Evern Bio 04/13/2013, 9:05 AM

## 2013-04-13 NOTE — Discharge Summary (Signed)
Physician Discharge Summary   Patient ID: Kristina Carter MRN: [id number] DOB/AGE: 1946-07-27 67 y.o.  Admit date: 04/11/2013 Discharge date: 04/13/2013  Primary Diagnosis:  Osteoarthritis Right knee  Admission Diagnoses:  Past Medical History  Diagnosis Date  . Hypertension   . Seasonal allergies   . Arthritis     osteoarthritis  . Dementia    Discharge Diagnoses:   Principal Problem:   OA (osteoarthritis) of knee  Estimated body mass index is 30.34 kg/(m^2) as calculated from the following:   Height as of this encounter: 5' 3.5" (1.613 m).   Weight as of this encounter: 78.926 kg (174 lb).  Procedure:  Procedure(s) (LRB): RIGHT TOTAL KNEE ARTHROPLASTY (Right)   Consults: None  HPI: Kristina Carter is a 67 y.o. year old female with end stage OA of her right knee with progressively worsening pain and dysfunction. She has constant pain, with activity and at rest and significant functional deficits with difficulties even with ADLs. She has had extensive non-op management including analgesics, injections of cortisone and viscosupplements, and home exercise program, but remains in significant pain with significant dysfunction.Radiographs show bone on bone arthritis medial and patellofemoral with varus deformity. She presents now for right Total Knee Arthroplasty.   Laboratory Data: Admission on 04/11/2013  Component Date Value Range Status  . WBC 04/12/2013 7.2  4.0 - 10.5 K/uL Final  . RBC 04/12/2013 3.94  3.87 - 5.11 MIL/uL Final  . Hemoglobin 04/12/2013 11.1* 12.0 - 15.0 g/dL Final  . HCT 40/98/1191 34.7* 36.0 - 46.0 % Final  . MCV 04/12/2013 88.1  78.0 - 100.0 fL Final  . MCH 04/12/2013 28.2  26.0 - 34.0 pg Final  . MCHC 04/12/2013 32.0  30.0 - 36.0 g/dL Final  . RDW 47/82/9562 13.9  11.5 - 15.5 % Final  . Platelets 04/12/2013 148* 150 - 400 K/uL Final  . Sodium 04/12/2013 137  135 - 145 mEq/L Final  . Potassium 04/12/2013 3.5  3.5 - 5.1 mEq/L Final  . Chloride  04/12/2013 102  96 - 112 mEq/L Final  . CO2 04/12/2013 29  19 - 32 mEq/L Final  . Glucose, Bld 04/12/2013 120* 70 - 99 mg/dL Final  . BUN 13/06/6577 11  6 - 23 mg/dL Final  . Creatinine, Ser 04/12/2013 0.51  0.50 - 1.10 mg/dL Final  . Calcium 46/96/2952 8.6  8.4 - 10.5 mg/dL Final  . GFR calc non Af Amer 04/12/2013 >90  >90 mL/min Final  . GFR calc Af Amer 04/12/2013 >90  >90 mL/min Final   Comment:                                 The eGFR has been calculated                          using the CKD EPI equation.                          This calculation has not been                          validated in all clinical                          situations.  eGFR's persistently                          <90 mL/min signify                          possible Chronic Kidney Disease.  . WBC 04/13/2013 11.4* 4.0 - 10.5 K/uL Final  . RBC 04/13/2013 3.65* 3.87 - 5.11 MIL/uL Final  . Hemoglobin 04/13/2013 10.3* 12.0 - 15.0 g/dL Final  . HCT 19/14/7829 32.1* 36.0 - 46.0 % Final  . MCV 04/13/2013 87.9  78.0 - 100.0 fL Final  . MCH 04/13/2013 28.2  26.0 - 34.0 pg Final  . MCHC 04/13/2013 32.1  30.0 - 36.0 g/dL Final  . RDW 56/21/3086 14.0  11.5 - 15.5 % Final  . Platelets 04/13/2013 144* 150 - 400 K/uL Final  . Sodium 04/13/2013 138  135 - 145 mEq/L Final  . Potassium 04/13/2013 3.4* 3.5 - 5.1 mEq/L Final  . Chloride 04/13/2013 101  96 - 112 mEq/L Final  . CO2 04/13/2013 30  19 - 32 mEq/L Final  . Glucose, Bld 04/13/2013 117* 70 - 99 mg/dL Final  . BUN 57/84/6962 13  6 - 23 mg/dL Final  . Creatinine, Ser 04/13/2013 0.57  0.50 - 1.10 mg/dL Final  . Calcium 95/28/4132 8.9  8.4 - 10.5 mg/dL Final  . GFR calc non Af Amer 04/13/2013 >90  >90 mL/min Final  . GFR calc Af Amer 04/13/2013 >90  >90 mL/min Final   Comment:                                 The eGFR has been calculated                          using the CKD EPI equation.                          This calculation has not  been                          validated in all clinical                          situations.                          eGFR's persistently                          <90 mL/min signify                          possible Chronic Kidney Disease.  Hospital Outpatient Visit on 04/04/2013  Component Date Value Range Status  . MRSA, PCR 04/04/2013 NEGATIVE  NEGATIVE Final  . Staphylococcus aureus 04/04/2013 NEGATIVE  NEGATIVE Final   Comment:                                 The Xpert SA Assay (FDA  approved for NASAL specimens                          in patients over 3 years of age),                          is one component of                          a comprehensive surveillance                          program.  Test performance has                          been validated by [employer] for patients greater                          than or equal to 40 year old.                          It is not intended                          to diagnose infection nor to                          guide or monitor treatment.  Marland Kitchen aPTT 04/04/2013 31  24 - 37 seconds Final  . WBC 04/04/2013 6.7  4.0 - 10.5 K/uL Final  . RBC 04/04/2013 4.66  3.87 - 5.11 MIL/uL Final  . Hemoglobin 04/04/2013 13.2  12.0 - 15.0 g/dL Final  . HCT 16/08/9603 40.6  36.0 - 46.0 % Final  . MCV 04/04/2013 87.1  78.0 - 100.0 fL Final  . MCH 04/04/2013 28.3  26.0 - 34.0 pg Final  . MCHC 04/04/2013 32.5  30.0 - 36.0 g/dL Final  . RDW 54/07/8118 13.9  11.5 - 15.5 % Final  . Platelets 04/04/2013 185  150 - 400 K/uL Final  . Sodium 04/04/2013 140  135 - 145 mEq/L Final  . Potassium 04/04/2013 3.9  3.5 - 5.1 mEq/L Final  . Chloride 04/04/2013 103  96 - 112 mEq/L Final  . CO2 04/04/2013 32  19 - 32 mEq/L Final  . Glucose, Bld 04/04/2013 108* 70 - 99 mg/dL Final  . BUN 14/78/2956 17  6 - 23 mg/dL Final  . Creatinine, Ser 04/04/2013 0.59  0.50 - 1.10 mg/dL Final  . Calcium 21/30/8657 9.5   8.4 - 10.5 mg/dL Final  . Total Protein 04/04/2013 7.0  6.0 - 8.3 g/dL Final  . Albumin 84/69/6295 3.7  3.5 - 5.2 g/dL Final  . AST 28/41/3244 19  0 - 37 U/L Final  . ALT 04/04/2013 15  0 - 35 U/L Final  . Alkaline Phosphatase 04/04/2013 70  39 - 117 U/L Final  . Total Bilirubin 04/04/2013 0.4  0.3 - 1.2 mg/dL Final  . GFR calc non Af Amer 04/04/2013 >90  >90 mL/min Final  . GFR calc Af Amer 04/04/2013 >90  >90 mL/min Final   Comment:  The eGFR has been calculated                          using the CKD EPI equation.                          This calculation has not been                          validated in all clinical                          situations.                          eGFR's persistently                          <90 mL/min signify                          possible Chronic Kidney Disease.  Marland Kitchen Prothrombin Time 04/04/2013 12.4  11.6 - 15.2 seconds Final  . INR 04/04/2013 0.93  0.00 - 1.49 Final  . ABO/RH(D) 04/04/2013 O POS   Final  . Antibody Screen 04/04/2013 NEG   Final  . Sample Expiration 04/04/2013 04/14/2013   Final  . Color, Urine 04/04/2013 YELLOW  YELLOW Final  . APPearance 04/04/2013 CLEAR  CLEAR Final  . Specific Gravity, Urine 04/04/2013 1.013  1.005 - 1.030 Final  . pH 04/04/2013 6.0  5.0 - 8.0 Final  . Glucose, UA 04/04/2013 NEGATIVE  NEGATIVE mg/dL Final  . Hgb urine dipstick 04/04/2013 NEGATIVE  NEGATIVE Final  . Bilirubin Urine 04/04/2013 NEGATIVE  NEGATIVE Final  . Ketones, ur 04/04/2013 NEGATIVE  NEGATIVE mg/dL Final  . Protein, ur 98/09/9146 NEGATIVE  NEGATIVE mg/dL Final  . Urobilinogen, UA 04/04/2013 0.2  0.0 - 1.0 mg/dL Final  . Nitrite 82/95/6213 NEGATIVE  NEGATIVE Final  . Leukocytes, UA 04/04/2013 MODERATE* NEGATIVE Final  . Squamous Epithelial / LPF 04/04/2013 FEW* RARE Final  . WBC, UA 04/04/2013 3-6  <3 WBC/hpf Final  . Bacteria, UA 04/04/2013 RARE  RARE Final     X-Rays:Dg Chest 2 View  04/04/2013    *RADIOLOGY REPORT*  Clinical Data: Preop  CHEST - 2 VIEW  Comparison: None.  Findings: Cardiomediastinal silhouette is unremarkable.  No acute infiltrate or pleural effusion.  No pulmonary edema.  Mild degenerative changes mid thoracic spine.  IMPRESSION:  No active disease.   Original Report Authenticated By: Natasha Mead, M.D.    EKG: Orders placed in visit on 04/04/13  . EKG 12-LEAD     Hospital Course: Kristina Carter is a 67 y.o. who was admitted to [facility]. They were brought to the operating room on 04/11/2013 and underwent Procedure(s): RIGHT TOTAL KNEE ARTHROPLASTY.  Patient tolerated the procedure well and was later transferred to the recovery room and then to the orthopaedic floor for postoperative care.  They were given PO and IV analgesics for pain control following their surgery.  They were given 24 hours of postoperative antibiotics of  Anti-infectives   Start     Dose/Rate Route Frequency Ordered Stop   04/11/13 1630  ceFAZolin (ANCEF) IVPB 1 g/50 mL premix     1 g 100 mL/hr over 30 Minutes Intravenous Every  6 hours 04/11/13 1307 04/11/13 2222   04/11/13 0745  ceFAZolin (ANCEF) IVPB 2 g/50 mL premix     2 g 100 mL/hr over 30 Minutes Intravenous On call to O.R. 04/11/13 1610 04/11/13 1025     and started on DVT prophylaxis in the form of Xarelto.   PT and OT were ordered for total joint protocol.  Discharge planning consulted to help with postop disposition and equipment needs.  Patient had a decent night on the evening of surgery.  They started to get up OOB with therapy on day one. Hemovac drain was pulled without difficulty.  Continued to work with therapy into day two.  Dressing was changed on day two and the incision was healing well. Patient was seen in rounds and was ready to go home later on POD 2.   Discharge Medications: Prior to Admission medications   Medication Sig Start Date End Date Taking? Authorizing Provider  labetalol (NORMODYNE) 200 MG tablet Take  200 mg by mouth 2 (two) times daily.   Yes Historical Provider, MD  losartan-hydrochlorothiazide (HYZAAR) 50-12.5 MG per tablet Take 1 tablet by mouth every morning.   Yes Historical Provider, MD  MOMETASONE FUROATE EX Place 1 drop into both ears 2 (two) times daily as needed. Itchy ears   Yes Historical Provider, MD  cetirizine (ZYRTEC) 10 MG tablet Take 10 mg by mouth daily.    Historical Provider, MD  HYDROmorphone (DILAUDID) 2 MG tablet Take 1-2 tablets (2-4 mg total) by mouth every 4 (four) hours as needed. 04/13/13   Alexzandrew Julien Girt, PA-C  methocarbamol (ROBAXIN) 500 MG tablet Take 1 tablet (500 mg total) by mouth every 6 (six) hours as needed. 04/13/13   Alexzandrew Julien Girt, PA-C  Olopatadine HCl (PATADAY) 0.2 % SOLN Apply 1 drop to eye daily as needed (dry eyes).    Historical Provider, MD  psyllium (REGULOID) 0.52 G capsule Take 1.04 g by mouth daily.    Historical Provider, MD  rivaroxaban (XARELTO) 10 MG TABS tablet Take 1 tablet (10 mg total) by mouth daily with breakfast. Take Xarelto for two and a half more weeks, then discontinue Xarelto. 04/13/13   Alexzandrew Perkins, PA-C  traMADol (ULTRAM) 50 MG tablet Take 1-2 tablets (50-100 mg total) by mouth every 6 (six) hours as needed (mild pain). 04/13/13   Alexzandrew Julien Girt, PA-C    Diet: Cardiac diet Activity:WBAT Follow-up:in 2 weeks Disposition - Home Discharged Condition: good   Discharge Orders   Future Orders Complete By Expires     Call MD / Call 911  As directed     Comments:      If you experience chest pain or shortness of breath, CALL 911 and be transported to the hospital emergency room.  If you develope a fever above 101 F, pus (white drainage) or increased drainage or redness at the wound, or calf pain, call your surgeon's office.    Change dressing  As directed     Comments:      Change dressing daily with sterile 4 x 4 inch gauze dressing and apply TED hose. Do not submerge the incision under water.     Constipation Prevention  As directed     Comments:      Drink plenty of fluids.  Prune juice may be helpful.  You may use a stool softener, such as Colace (over the counter) 100 mg twice a day.  Use MiraLax (over the counter) for constipation as needed.    Diet - low sodium  heart healthy  As directed     Discharge instructions  As directed     Comments:      Pick up stool softner and laxative for home. Do not submerge incision under water. May shower. Continue to use ice for pain and swelling from surgery. Hip precautions.  Total Hip Protocol.  Take Xarelto for two and a half more weeks, then discontinue Xarelto.    Do not put a pillow under the knee. Place it under the heel.  As directed     Do not sit on low chairs, stoools or toilet seats, as it may be difficult to get up from low surfaces  As directed     Driving restrictions  As directed     Comments:      No driving until released by the physician.    Increase activity slowly as tolerated  As directed     Lifting restrictions  As directed     Comments:      No lifting until released by the physician.    Patient may shower  As directed     Comments:      You may shower without a dressing once there is no drainage.  Do not wash over the wound.  If drainage remains, do not shower until drainage stops.    TED hose  As directed     Comments:      Use stockings (TED hose) for 3 weeks on both leg(s).  You may remove them at night for sleeping.    Weight bearing as tolerated  As directed         Medication List    STOP taking these medications       BIOTIN PO     Fish Oil 1200 MG Caps     ibuprofen 200 MG tablet  Commonly known as:  ADVIL,MOTRIN     multivitamin with minerals Tabs     naproxen sodium 220 MG tablet  Commonly known as:  ANAPROX      TAKE these medications       cetirizine 10 MG tablet  Commonly known as:  ZYRTEC  Take 10 mg by mouth daily.     HYDROmorphone 2 MG tablet  Commonly known as:   DILAUDID  Take 1-2 tablets (2-4 mg total) by mouth every 4 (four) hours as needed.     labetalol 200 MG tablet  Commonly known as:  NORMODYNE  Take 200 mg by mouth 2 (two) times daily.     losartan-hydrochlorothiazide 50-12.5 MG per tablet  Commonly known as:  HYZAAR  Take 1 tablet by mouth every morning.     methocarbamol 500 MG tablet  Commonly known as:  ROBAXIN  Take 1 tablet (500 mg total) by mouth every 6 (six) hours as needed.     MOMETASONE FUROATE EX  Place 1 drop into both ears 2 (two) times daily as needed. Itchy ears     PATADAY 0.2 % Soln  Generic drug:  Olopatadine HCl  Apply 1 drop to eye daily as needed (dry eyes).     psyllium 0.52 G capsule  Commonly known as:  REGULOID  Take 1.04 g by mouth daily.     rivaroxaban 10 MG Tabs tablet  Commonly known as:  XARELTO  Take 1 tablet (10 mg total) by mouth daily with breakfast. Take Xarelto for two and a half more weeks, then discontinue Xarelto.     traMADol 50 MG tablet  Commonly known as:  ULTRAM  Take 1-2 tablets (50-100 mg total) by mouth every 6 (six) hours as needed (mild pain).           Follow-up Information   Follow up with Loanne Drilling, MD. Schedule an appointment as soon as possible for a visit in 2 weeks.   Contact information:   [address], [address] [place] [address] [phone]       Signed: Patrica Duel 04/13/2013, 7:51 AM

## 2013-04-13 NOTE — Progress Notes (Signed)
Physical Therapy Treatment Patient Details Name: Kristina Carter MRN: [id number] DOB: Oct 22, 1946 Today's Date: 04/13/2013 Time: 5621-3086 PT Time Calculation (min): 31 min  PT Assessment / Plan / Recommendation Comments on Treatment Session  Pt continues to progress very well with all mobility and has performed single step.  She is ready for D/C today.     Follow Up Recommendations  Home health PT;Supervision for mobility/OOB     Does the patient have the potential to tolerate intense rehabilitation     Barriers to Discharge        Equipment Recommendations  None recommended by PT    Recommendations for Other Services    Frequency 7X/week   Plan Discharge plan remains appropriate    Precautions / Restrictions Precautions Precautions: Knee Required Braces or Orthoses: Knee Immobilizer - Right Knee Immobilizer - Right: Discontinue once straight leg raise with < 10 degree lag Restrictions Weight Bearing Restrictions: No Other Position/Activity Restrictions: WBAT   Pertinent Vitals/Pain 7/10, ice packs applied, RN made aware.     Mobility  Bed Mobility Bed Mobility: Supine to Sit Supine to Sit: 4: Min assist;HOB flat Details for Bed Mobility Assistance: Assist for RLE out of bed.  Pt demos good UE use to self assist.  Transfers Transfers: Sit to Stand;Stand to Sit Sit to Stand: 5: Supervision;With upper extremity assist;From bed Stand to Sit: 4: Min guard;With armrests;To chair/3-in-1 Details for Transfer Assistance: Some assist with RLE when sitting, however pt demos good UE technique.  Ambulation/Gait Ambulation/Gait Assistance: 5: Supervision Ambulation Distance (Feet): 75 Feet Assistive device: Rolling walker Ambulation/Gait Assistance Details: Min cues for upright and relaxed posture and not stepping too far inside of RW.  Gait Pattern: Step-to pattern;Antalgic;Trunk flexed Gait velocity: decreased Stairs: Yes Stairs Assistance: 4: Min guard Stair Management  Technique: No rails;Step to pattern;Backwards;With walker;Forwards Number of Stairs: 1 (x 2 reps)    Exercises Total Joint Exercises Ankle Circles/Pumps: AROM;Both;20 reps Quad Sets: AROM;Right;10 reps Short Arc QuadBarbaraann Carter;Right;10 reps Heel Slides: AAROM;Right;10 reps Hip ABduction/ADduction: AAROM;Right;10 reps Straight Leg Raises: AAROM;Right;10 reps Goniometric ROM: pt with approx 47 deg knee flex   PT Diagnosis:    PT Problem List:   PT Treatment Interventions:     PT Goals Acute Rehab PT Goals PT Goal Formulation: With patient Time For Goal Achievement: 04/15/13 Potential to Achieve Goals: Good Pt will go Supine/Side to Sit: with supervision PT Goal: Supine/Side to Sit - Progress: Progressing toward goal Pt will go Sit to Stand: with supervision PT Goal: Sit to Stand - Progress: Met Pt will go Stand to Sit: with supervision PT Goal: Stand to Sit - Progress: Partly met Pt will Ambulate: 51 - 150 feet;with supervision;with least restrictive assistive device PT Goal: Ambulate - Progress: Met Pt will Go Up / Down Stairs: 1-2 stairs;with min assist;with least restrictive assistive device PT Goal: Up/Down Stairs - Progress: Met Pt will Perform Home Exercise Program: with supervision, verbal cues required/provided PT Goal: Perform Home Exercise Program - Progress: Met  Visit Information  Last PT Received On: 04/13/13 Assistance Needed: +1    Subjective Data  Subjective: I'm ready to go home.  Patient Stated Goal: to return home.    Cognition  Cognition Arousal/Alertness: Awake/alert Behavior During Therapy: WFL for tasks assessed/performed Overall Cognitive Status: Within Functional Limits for tasks assessed    Balance     End of Session PT - End of Session Equipment Utilized During Treatment: Right knee immobilizer Activity Tolerance: Patient tolerated treatment well Patient left:  in chair;with call bell/phone within reach Nurse Communication: Mobility  status;Patient requests pain meds   Kristina Carter     Kristina Carter 04/13/2013, 9:18 AM

## 2013-04-13 NOTE — Care Management Note (Signed)
    Page 1 of 2   04/13/2013     3:07:48 PM   CARE MANAGEMENT NOTE 04/13/2013  Patient:  Kristina Carter, ROUILLARD   Account Number:  [id number]  Date Initiated:  04/13/2013  Documentation initiated by:  Colleen Can  Subjective/Objective Assessment:   dx osteoarthritis righjt knee; total knee replacemnt on day of admission.  Per-arranged with Genevieve Norlander for [facility] services.     Action/Plan:   CM spoke with patient. Plans are for pt to return to her home in [place] where daughter-in;law will be caregiver. She already has RW and 3n1 has been delivered to her room. Genevieve Norlander will providde [facility] services.   Anticipated DC Date:  04/13/2013   Anticipated DC Plan:  HOME W HOME HEALTH SERVICES  In-house referral  Clinical Social Worker      DC Planning Services  CM consult      [facility] Choice  HOME HEALTH  DURABLE MEDICAL EQUIPMENT   Choice offered to / List presented to:  C-1 Patient   DME arranged  3-N-1      DME agency  [facility]     HH arranged  HH-2 PT      [facility] agency  [facility]   Status of service:  Completed, signed off Medicare Important Message given?   (If response is "NO", the following Medicare IM given date fields will be blank) Date Medicare IM given:   Date Additional Medicare IM given:    Discharge Disposition:  HOME W HOME HEALTH SERVICES  Per UR Regulation:    If discussed at Long Length of Stay Meetings, dates discussed:    Comments:  04/13/2013 Colleen Can BSN RN CCM [phone] Genevieve Norlander will start HHpt services tomorrow 04/14/2013.

## 2013-04-13 NOTE — Progress Notes (Signed)
   Subjective: 2 Days Post-Op Procedure(s) (LRB): RIGHT TOTAL KNEE ARTHROPLASTY (Right) Patient reports pain as mild.   Patient seen in rounds with Dr. Lequita Halt.  Did not sleep much last night. Patient is well, and has had no acute complaints or problems Patient is ready to go home later today.  Objective: Vital signs in last 24 hours: Temp:  [98.6 F (37 C)-99 F (37.2 C)] 98.7 F (37.1 C) (05/21 0706) Pulse Rate:  [70-79] 77 (05/21 0706) Resp:  [16-18] 16 (05/21 0706) BP: (146-189)/(57-99) 189/99 mmHg (05/21 0706) SpO2:  [90 %-95 %] 94 % (05/21 0706)  Intake/Output from previous day:  Intake/Output Summary (Last 24 hours) at 04/13/13 0714 Last data filed at 04/13/13 0543  Gross per 24 hour  Intake 1998.09 ml  Output   2100 ml  Net -101.91 ml    Intake/Output this shift:    Labs:  Recent Labs  04/12/13 0424 04/13/13 0438  HGB 11.1* 10.3*    Recent Labs  04/12/13 0424 04/13/13 0438  WBC 7.2 11.4*  RBC 3.94 3.65*  HCT 34.7* 32.1*  PLT 148* 144*    Recent Labs  04/12/13 0424 04/13/13 0438  NA 137 138  K 3.5 3.4*  CL 102 101  CO2 29 30  BUN 11 13  CREATININE 0.51 0.57  GLUCOSE 120* 117*  CALCIUM 8.6 8.9   No results found for this basename: LABPT, INR,  in the last 72 hours  EXAM: General - Patient is Alert, Appropriate and Oriented Extremity - Neurovascular intact Sensation intact distally Dorsiflexion/Plantar flexion intact No cellulitis present Incision - clean, dry, no drainage, healing Motor Function - intact, moving foot and toes well on exam.   Assessment/Plan: 2 Days Post-Op Procedure(s) (LRB): RIGHT TOTAL KNEE ARTHROPLASTY (Right) Procedure(s) (LRB): RIGHT TOTAL KNEE ARTHROPLASTY (Right) Past Medical History  Diagnosis Date  . Hypertension   . Seasonal allergies   . Arthritis     osteoarthritis  . Dementia    Principal Problem:   OA (osteoarthritis) of knee  Estimated body mass index is 30.34 kg/(m^2) as calculated from  the following:   Height as of this encounter: 5' 3.5" (1.613 m).   Weight as of this encounter: 78.926 kg (174 lb). Up with therapy Diet - Cardiac diet Follow up - in 2 weeks Activity - WBAT Disposition - Home Condition Upon Discharge - Good D/C Meds - See DC Summary DVT Prophylaxis - Xarelto  PERKINS, ALEXZANDREW 04/13/2013, 7:14 AM

## 2013-04-15 DIAGNOSIS — Z96659 Presence of unspecified artificial knee joint: Secondary | ICD-10-CM | POA: Diagnosis not present

## 2013-04-15 DIAGNOSIS — F0390 Unspecified dementia without behavioral disturbance: Secondary | ICD-10-CM | POA: Diagnosis not present

## 2013-04-15 DIAGNOSIS — Z471 Aftercare following joint replacement surgery: Secondary | ICD-10-CM | POA: Diagnosis not present

## 2013-04-15 DIAGNOSIS — I10 Essential (primary) hypertension: Secondary | ICD-10-CM | POA: Diagnosis not present

## 2013-04-15 DIAGNOSIS — M1710 Unilateral primary osteoarthritis, unspecified knee: Secondary | ICD-10-CM | POA: Diagnosis not present

## 2013-04-15 DIAGNOSIS — IMO0001 Reserved for inherently not codable concepts without codable children: Secondary | ICD-10-CM | POA: Diagnosis not present

## 2013-04-19 DIAGNOSIS — Z96659 Presence of unspecified artificial knee joint: Secondary | ICD-10-CM | POA: Diagnosis not present

## 2013-04-19 DIAGNOSIS — F0390 Unspecified dementia without behavioral disturbance: Secondary | ICD-10-CM | POA: Diagnosis not present

## 2013-04-19 DIAGNOSIS — I10 Essential (primary) hypertension: Secondary | ICD-10-CM | POA: Diagnosis not present

## 2013-04-19 DIAGNOSIS — IMO0001 Reserved for inherently not codable concepts without codable children: Secondary | ICD-10-CM | POA: Diagnosis not present

## 2013-04-19 DIAGNOSIS — Z471 Aftercare following joint replacement surgery: Secondary | ICD-10-CM | POA: Diagnosis not present

## 2013-04-20 DIAGNOSIS — Z96659 Presence of unspecified artificial knee joint: Secondary | ICD-10-CM | POA: Diagnosis not present

## 2013-04-20 DIAGNOSIS — I10 Essential (primary) hypertension: Secondary | ICD-10-CM | POA: Diagnosis not present

## 2013-04-20 DIAGNOSIS — F0390 Unspecified dementia without behavioral disturbance: Secondary | ICD-10-CM | POA: Diagnosis not present

## 2013-04-20 DIAGNOSIS — Z471 Aftercare following joint replacement surgery: Secondary | ICD-10-CM | POA: Diagnosis not present

## 2013-04-20 DIAGNOSIS — IMO0001 Reserved for inherently not codable concepts without codable children: Secondary | ICD-10-CM | POA: Diagnosis not present

## 2013-04-21 DIAGNOSIS — F0390 Unspecified dementia without behavioral disturbance: Secondary | ICD-10-CM | POA: Diagnosis not present

## 2013-04-21 DIAGNOSIS — IMO0001 Reserved for inherently not codable concepts without codable children: Secondary | ICD-10-CM | POA: Diagnosis not present

## 2013-04-21 DIAGNOSIS — I10 Essential (primary) hypertension: Secondary | ICD-10-CM | POA: Diagnosis not present

## 2013-04-21 DIAGNOSIS — Z471 Aftercare following joint replacement surgery: Secondary | ICD-10-CM | POA: Diagnosis not present

## 2013-04-21 DIAGNOSIS — Z96659 Presence of unspecified artificial knee joint: Secondary | ICD-10-CM | POA: Diagnosis not present

## 2013-04-22 DIAGNOSIS — Z471 Aftercare following joint replacement surgery: Secondary | ICD-10-CM | POA: Diagnosis not present

## 2013-04-22 DIAGNOSIS — F0390 Unspecified dementia without behavioral disturbance: Secondary | ICD-10-CM | POA: Diagnosis not present

## 2013-04-22 DIAGNOSIS — I10 Essential (primary) hypertension: Secondary | ICD-10-CM | POA: Diagnosis not present

## 2013-04-22 DIAGNOSIS — Z96659 Presence of unspecified artificial knee joint: Secondary | ICD-10-CM | POA: Diagnosis not present

## 2013-04-22 DIAGNOSIS — IMO0001 Reserved for inherently not codable concepts without codable children: Secondary | ICD-10-CM | POA: Diagnosis not present

## 2013-04-25 DIAGNOSIS — F0390 Unspecified dementia without behavioral disturbance: Secondary | ICD-10-CM | POA: Diagnosis not present

## 2013-04-25 DIAGNOSIS — I10 Essential (primary) hypertension: Secondary | ICD-10-CM | POA: Diagnosis not present

## 2013-04-25 DIAGNOSIS — Z471 Aftercare following joint replacement surgery: Secondary | ICD-10-CM | POA: Diagnosis not present

## 2013-04-25 DIAGNOSIS — Z96659 Presence of unspecified artificial knee joint: Secondary | ICD-10-CM | POA: Diagnosis not present

## 2013-04-25 DIAGNOSIS — IMO0001 Reserved for inherently not codable concepts without codable children: Secondary | ICD-10-CM | POA: Diagnosis not present

## 2013-04-26 DIAGNOSIS — Z471 Aftercare following joint replacement surgery: Secondary | ICD-10-CM | POA: Diagnosis not present

## 2013-04-26 DIAGNOSIS — Z96659 Presence of unspecified artificial knee joint: Secondary | ICD-10-CM | POA: Diagnosis not present

## 2013-04-26 DIAGNOSIS — I10 Essential (primary) hypertension: Secondary | ICD-10-CM | POA: Diagnosis not present

## 2013-04-26 DIAGNOSIS — F0390 Unspecified dementia without behavioral disturbance: Secondary | ICD-10-CM | POA: Diagnosis not present

## 2013-04-26 DIAGNOSIS — IMO0001 Reserved for inherently not codable concepts without codable children: Secondary | ICD-10-CM | POA: Diagnosis not present

## 2013-04-27 DIAGNOSIS — Z471 Aftercare following joint replacement surgery: Secondary | ICD-10-CM | POA: Diagnosis not present

## 2013-04-27 DIAGNOSIS — IMO0001 Reserved for inherently not codable concepts without codable children: Secondary | ICD-10-CM | POA: Diagnosis not present

## 2013-04-27 DIAGNOSIS — F0390 Unspecified dementia without behavioral disturbance: Secondary | ICD-10-CM | POA: Diagnosis not present

## 2013-04-27 DIAGNOSIS — I10 Essential (primary) hypertension: Secondary | ICD-10-CM | POA: Diagnosis not present

## 2013-04-27 DIAGNOSIS — Z96659 Presence of unspecified artificial knee joint: Secondary | ICD-10-CM | POA: Diagnosis not present

## 2013-04-28 DIAGNOSIS — Z96659 Presence of unspecified artificial knee joint: Secondary | ICD-10-CM | POA: Diagnosis not present

## 2013-04-28 DIAGNOSIS — I10 Essential (primary) hypertension: Secondary | ICD-10-CM | POA: Diagnosis not present

## 2013-04-28 DIAGNOSIS — IMO0001 Reserved for inherently not codable concepts without codable children: Secondary | ICD-10-CM | POA: Diagnosis not present

## 2013-04-28 DIAGNOSIS — F0390 Unspecified dementia without behavioral disturbance: Secondary | ICD-10-CM | POA: Diagnosis not present

## 2013-04-28 DIAGNOSIS — Z471 Aftercare following joint replacement surgery: Secondary | ICD-10-CM | POA: Diagnosis not present

## 2013-04-29 DIAGNOSIS — Z96659 Presence of unspecified artificial knee joint: Secondary | ICD-10-CM | POA: Diagnosis not present

## 2013-04-29 DIAGNOSIS — IMO0001 Reserved for inherently not codable concepts without codable children: Secondary | ICD-10-CM | POA: Diagnosis not present

## 2013-04-29 DIAGNOSIS — F0390 Unspecified dementia without behavioral disturbance: Secondary | ICD-10-CM | POA: Diagnosis not present

## 2013-04-29 DIAGNOSIS — Z471 Aftercare following joint replacement surgery: Secondary | ICD-10-CM | POA: Diagnosis not present

## 2013-04-29 DIAGNOSIS — I10 Essential (primary) hypertension: Secondary | ICD-10-CM | POA: Diagnosis not present

## 2013-05-02 DIAGNOSIS — M25569 Pain in unspecified knee: Secondary | ICD-10-CM | POA: Diagnosis not present

## 2013-05-04 DIAGNOSIS — M25569 Pain in unspecified knee: Secondary | ICD-10-CM | POA: Diagnosis not present

## 2013-05-05 DIAGNOSIS — M25569 Pain in unspecified knee: Secondary | ICD-10-CM | POA: Diagnosis not present

## 2013-05-09 DIAGNOSIS — M25569 Pain in unspecified knee: Secondary | ICD-10-CM | POA: Diagnosis not present

## 2013-05-11 DIAGNOSIS — M25569 Pain in unspecified knee: Secondary | ICD-10-CM | POA: Diagnosis not present

## 2013-05-12 DIAGNOSIS — M25569 Pain in unspecified knee: Secondary | ICD-10-CM | POA: Diagnosis not present

## 2013-05-16 DIAGNOSIS — M25569 Pain in unspecified knee: Secondary | ICD-10-CM | POA: Diagnosis not present

## 2013-05-17 DIAGNOSIS — Z471 Aftercare following joint replacement surgery: Secondary | ICD-10-CM | POA: Diagnosis not present

## 2013-05-17 DIAGNOSIS — M25569 Pain in unspecified knee: Secondary | ICD-10-CM | POA: Diagnosis not present

## 2013-05-17 DIAGNOSIS — Z96659 Presence of unspecified artificial knee joint: Secondary | ICD-10-CM | POA: Diagnosis not present

## 2013-05-18 DIAGNOSIS — M1710 Unilateral primary osteoarthritis, unspecified knee: Secondary | ICD-10-CM | POA: Diagnosis not present

## 2013-05-19 DIAGNOSIS — M1710 Unilateral primary osteoarthritis, unspecified knee: Secondary | ICD-10-CM | POA: Diagnosis not present

## 2013-05-30 DIAGNOSIS — M1710 Unilateral primary osteoarthritis, unspecified knee: Secondary | ICD-10-CM | POA: Diagnosis not present

## 2013-06-01 DIAGNOSIS — M1710 Unilateral primary osteoarthritis, unspecified knee: Secondary | ICD-10-CM | POA: Diagnosis not present

## 2013-06-03 DIAGNOSIS — M1710 Unilateral primary osteoarthritis, unspecified knee: Secondary | ICD-10-CM | POA: Diagnosis not present

## 2013-06-17 ENCOUNTER — Encounter: Payer: Self-pay | Admitting: *Deleted

## 2013-06-17 ENCOUNTER — Encounter: Payer: Self-pay | Admitting: Nurse Practitioner

## 2013-06-17 ENCOUNTER — Ambulatory Visit (INDEPENDENT_AMBULATORY_CARE_PROVIDER_SITE_OTHER): Payer: Medicare Other | Admitting: Nurse Practitioner

## 2013-06-17 VITALS — BP 142/74 | HR 72 | Resp 14 | Ht 63.0 in | Wt 170.2 lb

## 2013-06-17 DIAGNOSIS — R928 Other abnormal and inconclusive findings on diagnostic imaging of breast: Secondary | ICD-10-CM

## 2013-06-17 DIAGNOSIS — M899 Disorder of bone, unspecified: Secondary | ICD-10-CM | POA: Diagnosis not present

## 2013-06-17 DIAGNOSIS — Z01419 Encounter for gynecological examination (general) (routine) without abnormal findings: Secondary | ICD-10-CM | POA: Diagnosis not present

## 2013-06-17 DIAGNOSIS — M949 Disorder of cartilage, unspecified: Secondary | ICD-10-CM | POA: Diagnosis not present

## 2013-06-17 DIAGNOSIS — Z124 Encounter for screening for malignant neoplasm of cervix: Secondary | ICD-10-CM | POA: Diagnosis not present

## 2013-06-17 DIAGNOSIS — M8580 Other specified disorders of bone density and structure, unspecified site: Secondary | ICD-10-CM

## 2013-06-17 NOTE — Progress Notes (Signed)
67 y.o. G2P2 Re-married Caucasian Fe here for annual exam.  Still some vaginal dryness using OTC lubrication. Same partner for 14 years.  Since right knee replacement some insomnia issues.  No LMP recorded. Patient is postmenopausal.          Sexually active: yes  The current method of family planning is post menopausal status.    Exercising: no  The patient does not participate in regular exercise at present. Smoker:  no  Health Maintenance: Pap:  05/09/2010  negative MMG:  10/2012  Due for 6 months repeat now Colonoscopy:  2010 normal recheck in 10 years BMD:   09/30/2010 T Score: spine -1.2 /left forearm -2.0;     left femur neck -1.4;  total 0-0.7 TDaP:  PCP maintains tetanus.  Labs: PCP maintains all labs and urine.    reports that she has never smoked. She has never used smokeless tobacco. She reports that  drinks alcohol. She reports that she does not use illicit drugs.  Past Medical History  Diagnosis Date  . Hypertension   . Seasonal allergies   . Arthritis     osteoarthritis  . Dementia   . Post-menopausal     Past Surgical History  Procedure Laterality Date  . Joint replacement      LTKA- 4 yrs ago  . Pilonidal cyst excision    . Blepharoplasty      bilateral  . Breast surgery      12'13 left breast -benign- biopsy  . Total knee arthroplasty Right 04/11/2013    Procedure: RIGHT TOTAL KNEE ARTHROPLASTY;  Surgeon: Loanne Drilling, MD;  Location: [facility];  Service: Orthopedics;  Laterality: Right;    Current Outpatient Prescriptions  Medication Sig Dispense Refill  . Biotin 1 MG CAPS Take by mouth.      . cetirizine (ZYRTEC) 10 MG tablet Take 10 mg by mouth daily.      . fish oil-omega-3 fatty acids 1000 MG capsule Take 2 g by mouth daily.      Marland Kitchen labetalol (NORMODYNE) 200 MG tablet Take 200 mg by mouth 2 (two) times daily.      Marland Kitchen lisinopril-hydrochlorothiazide (PRINZIDE,ZESTORETIC) 20-12.5 MG per tablet Take 1 tablet by mouth daily.      Marland Kitchen  losartan-hydrochlorothiazide (HYZAAR) 50-12.5 MG per tablet Take 1 tablet by mouth every morning.      . methocarbamol (ROBAXIN) 500 MG tablet Take 1 tablet (500 mg total) by mouth every 6 (six) hours as needed.  80 tablet  0  . Multiple Vitamin (MULTIVITAMIN) tablet Take 1 tablet by mouth daily.      . rivaroxaban (XARELTO) 10 MG TABS tablet Take 1 tablet (10 mg total) by mouth daily with breakfast. Take Xarelto for two and a half more weeks, then discontinue Xarelto.  19 tablet  0   No current facility-administered medications for this visit.    Family History  Problem Relation Age of Onset  . Heart failure Mother   . Hypertension Mother   . Osteoarthritis Mother   . Heart failure Father   . Hypertension Father   . Breast cancer Maternal Aunt   . Thyroid disease Sister     ROS:  Pertinent items are noted in HPI.  Otherwise, a comprehensive ROS was negative.  Exam:   BP 142/74  Pulse 72  Resp 14  Ht 5\' 3"  (1.6 m)  Wt 170 lb 3.2 oz (77.202 kg)  BMI 30.16 kg/m2 Height: 5\' 3"  (160 cm)  Ht Readings from  Last 3 Encounters:  06/17/13 5\' 3"  (1.6 m)  04/11/13 5' 3.5" (1.613 m)  04/11/13 5' 3.5" (1.613 m)    General appearance: alert, cooperative and appears stated age Head: Normocephalic, without obvious abnormality, atraumatic Neck: no adenopathy, supple, symmetrical, trachea midline and thyroid normal to inspection and palpation Lungs: clear to auscultation bilaterally Breasts: normal appearance, no masses or tenderness Heart: regular rate and rhythm Abdomen: soft, non-tender; no masses,  no organomegaly Extremities: extremities normal, atraumatic, no cyanosis or edema Skin: Skin color, texture, turgor normal. No rashes or lesions Lymph nodes: Cervical, supraclavicular, and axillary nodes normal. No abnormal inguinal nodes palpated Neurologic: Grossly normal   Pelvic: External genitalia:  no lesions              Urethra:  normal appearing urethra with no masses, tenderness  or lesions              Bartholin's and Skene's: normal                 Vagina: atrophic appearing vagina with pale color and discharge, no lesions              Cervix: anteverted              Pap taken: no Bimanual Exam:  Uterus:  normal size, contour, position, consistency, mobility, non-tender              Adnexa: no mass, fullness, tenderness               Rectovaginal: Confirms               Anus:  normal sphincter tone, no lesions  A:  Well Woman with normal exam  Postmenopausal  History of recent abnormal mammo in 12/13  Atrophic vaginitis  Insomnia  S/P bilateral knee replacements  P:   Pap smear as per guidelines   M[facility] order placed for now at [facility]  Order placed for repeat BMD  counseled on breast self exam, osteoporosis, adequate intake of  calcium and vitamin D, diet and exercise, Kegel's exercises return annually or prn  An After Visit Summary was printed and given to the patient.

## 2013-06-17 NOTE — Patient Instructions (Signed)

## 2013-06-21 NOTE — Progress Notes (Signed)
Encounter reviewed by Dr. Brook Silva.  

## 2013-06-22 DIAGNOSIS — Z471 Aftercare following joint replacement surgery: Secondary | ICD-10-CM | POA: Diagnosis not present

## 2013-08-02 DIAGNOSIS — Z0000 Encounter for general adult medical examination without abnormal findings: Secondary | ICD-10-CM | POA: Diagnosis not present

## 2013-08-02 DIAGNOSIS — Z23 Encounter for immunization: Secondary | ICD-10-CM | POA: Diagnosis not present

## 2013-08-05 ENCOUNTER — Telehealth: Payer: Self-pay | Admitting: Gynecology

## 2013-08-05 NOTE — Telephone Encounter (Signed)
Patient returning you call 

## 2013-08-05 NOTE — Telephone Encounter (Signed)
I didn't call this patient.

## 2013-08-24 DIAGNOSIS — Z09 Encounter for follow-up examination after completed treatment for conditions other than malignant neoplasm: Secondary | ICD-10-CM | POA: Diagnosis not present

## 2013-08-24 DIAGNOSIS — D249 Benign neoplasm of unspecified breast: Secondary | ICD-10-CM | POA: Diagnosis not present

## 2013-09-29 ENCOUNTER — Other Ambulatory Visit: Payer: Self-pay

## 2013-10-18 DIAGNOSIS — Z471 Aftercare following joint replacement surgery: Secondary | ICD-10-CM | POA: Diagnosis not present

## 2013-10-18 DIAGNOSIS — M1710 Unilateral primary osteoarthritis, unspecified knee: Secondary | ICD-10-CM | POA: Diagnosis not present

## 2014-02-01 DIAGNOSIS — R1310 Dysphagia, unspecified: Secondary | ICD-10-CM | POA: Diagnosis not present

## 2014-02-01 DIAGNOSIS — I10 Essential (primary) hypertension: Secondary | ICD-10-CM | POA: Diagnosis not present

## 2014-02-01 DIAGNOSIS — M19049 Primary osteoarthritis, unspecified hand: Secondary | ICD-10-CM | POA: Diagnosis not present

## 2014-02-01 DIAGNOSIS — Z23 Encounter for immunization: Secondary | ICD-10-CM | POA: Diagnosis not present

## 2014-02-09 ENCOUNTER — Other Ambulatory Visit: Payer: Self-pay | Admitting: Gastroenterology

## 2014-02-09 DIAGNOSIS — R1310 Dysphagia, unspecified: Secondary | ICD-10-CM | POA: Diagnosis not present

## 2014-03-02 ENCOUNTER — Ambulatory Visit
Admission: RE | Admit: 2014-03-02 | Discharge: 2014-03-02 | Disposition: A | Discharge status: Home / Self Care | Payer: Medicare Other | Source: Ambulatory Visit | Attending: Gastroenterology | Admitting: Gastroenterology

## 2014-03-02 DIAGNOSIS — K449 Diaphragmatic hernia without obstruction or gangrene: Secondary | ICD-10-CM | POA: Diagnosis not present

## 2014-03-02 DIAGNOSIS — R1310 Dysphagia, unspecified: Secondary | ICD-10-CM

## 2014-03-02 DIAGNOSIS — K219 Gastro-esophageal reflux disease without esophagitis: Secondary | ICD-10-CM | POA: Diagnosis not present

## 2014-09-25 ENCOUNTER — Encounter: Payer: Self-pay | Admitting: Nurse Practitioner

## 2014-09-27 IMAGING — CR DG CHEST 2V
2 series · 2 of 2 positions shown · non-contrast
Comparison: None.

CLINICAL DATA: Preop

CHEST - 2 VIEW

[w chest pa]
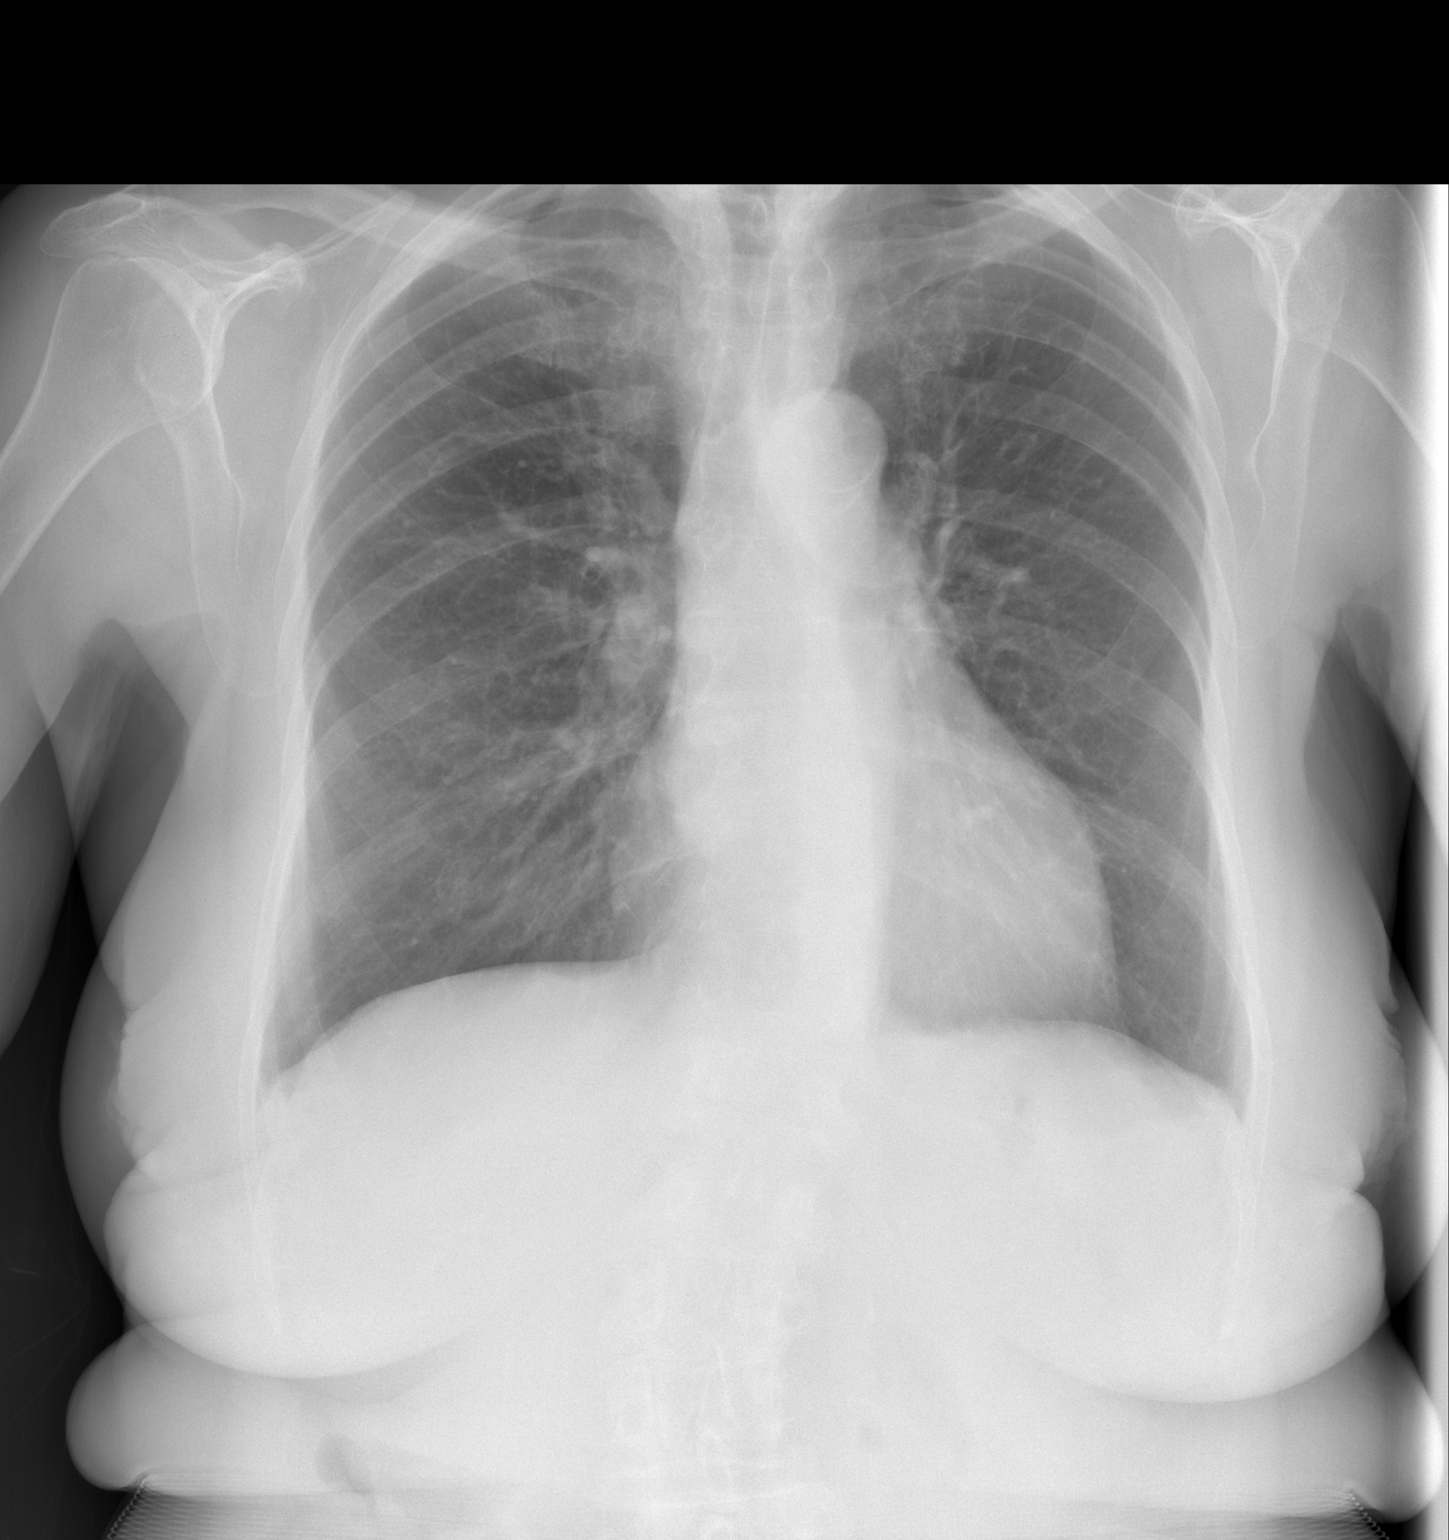

[w chest lat]
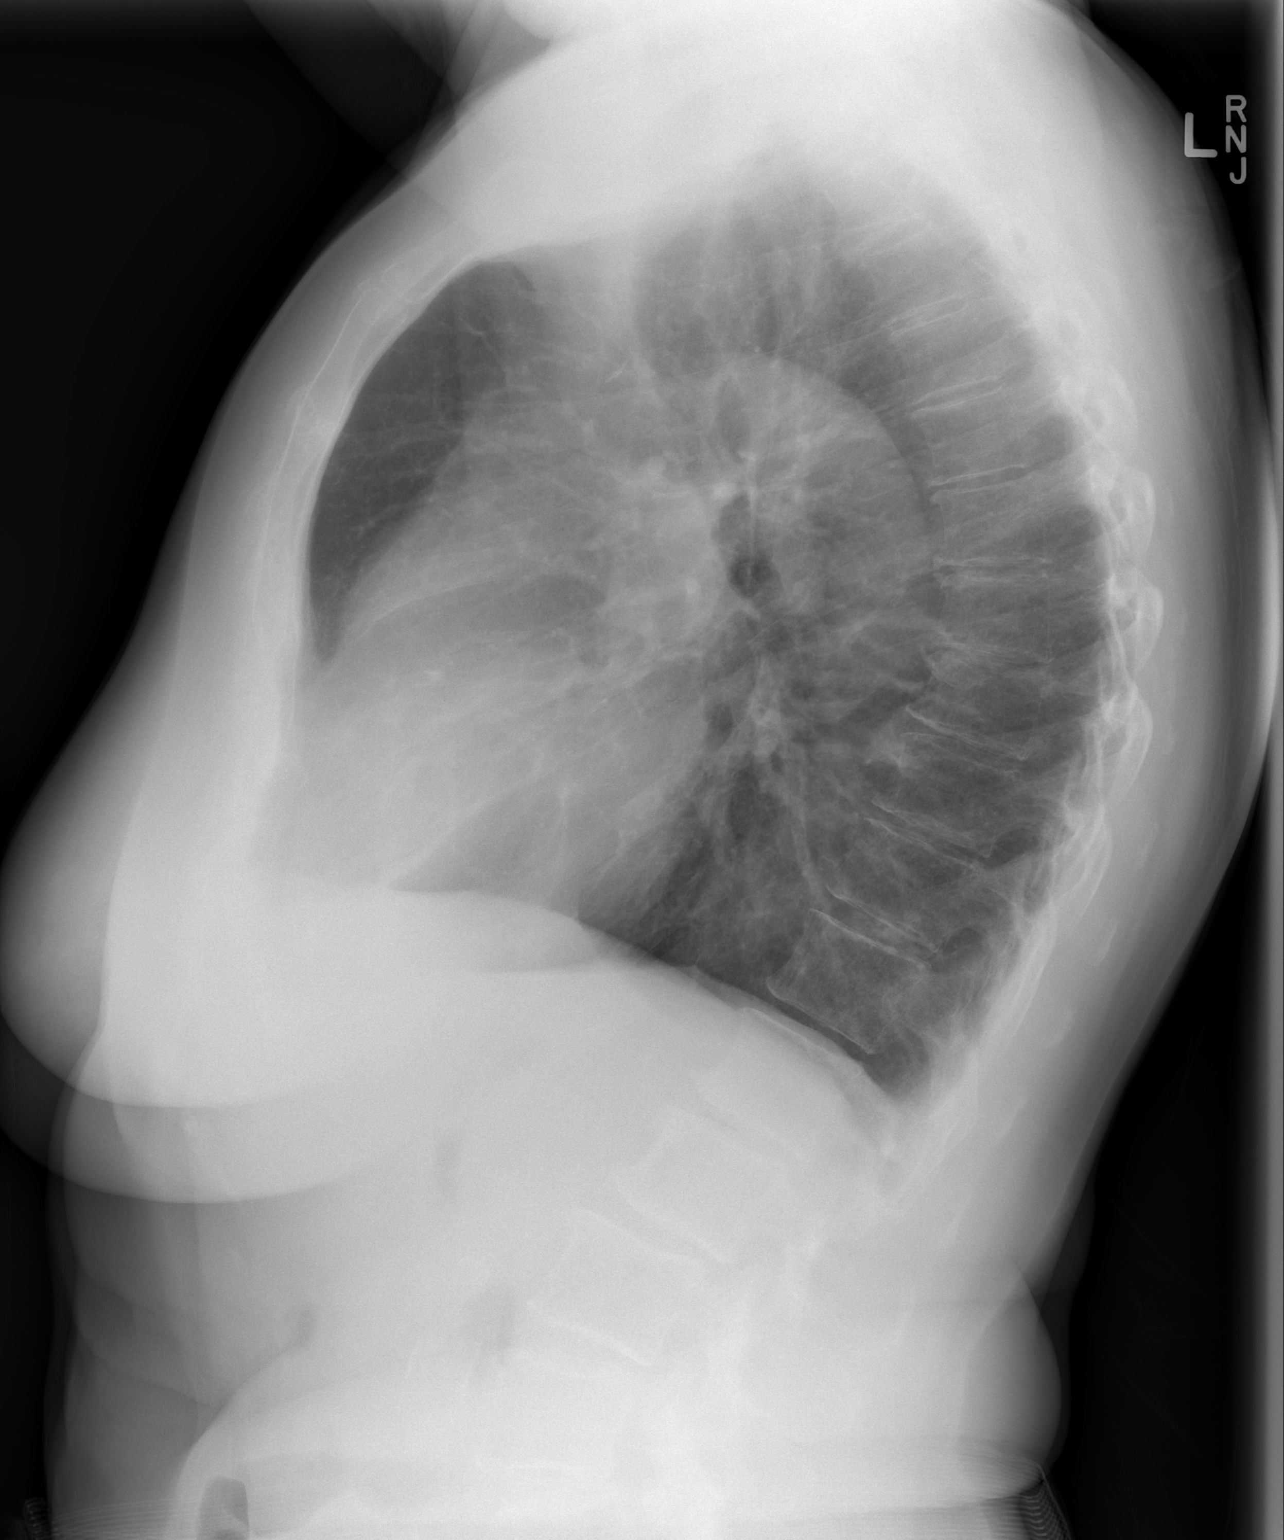

[2 of 2 positions shown; findings below may reference images not displayed]

FINDINGS: Cardiomediastinal silhouette is unremarkable.  No acute
infiltrate or pleural effusion.  No pulmonary edema.  Mild
degenerative changes mid thoracic spine.
IMPRESSION: No active disease.

## 2015-04-17 DIAGNOSIS — Z1231 Encounter for screening mammogram for malignant neoplasm of breast: Secondary | ICD-10-CM | POA: Diagnosis not present

## 2015-07-25 DIAGNOSIS — I10 Essential (primary) hypertension: Secondary | ICD-10-CM | POA: Diagnosis not present

## 2015-07-25 DIAGNOSIS — M189 Osteoarthritis of first carpometacarpal joint, unspecified: Secondary | ICD-10-CM | POA: Diagnosis not present

## 2015-07-25 DIAGNOSIS — Z23 Encounter for immunization: Secondary | ICD-10-CM | POA: Diagnosis not present

## 2015-08-29 DIAGNOSIS — H25042 Posterior subcapsular polar age-related cataract, left eye: Secondary | ICD-10-CM | POA: Diagnosis not present

## 2015-08-29 DIAGNOSIS — H25011 Cortical age-related cataract, right eye: Secondary | ICD-10-CM | POA: Diagnosis not present

## 2015-08-29 DIAGNOSIS — H2512 Age-related nuclear cataract, left eye: Secondary | ICD-10-CM | POA: Diagnosis not present

## 2015-08-29 DIAGNOSIS — H25041 Posterior subcapsular polar age-related cataract, right eye: Secondary | ICD-10-CM | POA: Diagnosis not present

## 2015-08-29 DIAGNOSIS — H25012 Cortical age-related cataract, left eye: Secondary | ICD-10-CM | POA: Diagnosis not present

## 2015-08-29 DIAGNOSIS — H2511 Age-related nuclear cataract, right eye: Secondary | ICD-10-CM | POA: Diagnosis not present

## 2015-09-11 DIAGNOSIS — H2512 Age-related nuclear cataract, left eye: Secondary | ICD-10-CM | POA: Diagnosis not present

## 2015-09-11 DIAGNOSIS — H25012 Cortical age-related cataract, left eye: Secondary | ICD-10-CM | POA: Diagnosis not present

## 2015-09-12 DIAGNOSIS — H25041 Posterior subcapsular polar age-related cataract, right eye: Secondary | ICD-10-CM | POA: Diagnosis not present

## 2015-09-12 DIAGNOSIS — H2511 Age-related nuclear cataract, right eye: Secondary | ICD-10-CM | POA: Diagnosis not present

## 2015-09-12 DIAGNOSIS — H25011 Cortical age-related cataract, right eye: Secondary | ICD-10-CM | POA: Diagnosis not present

## 2015-11-06 DIAGNOSIS — H2511 Age-related nuclear cataract, right eye: Secondary | ICD-10-CM | POA: Diagnosis not present

## 2015-11-06 DIAGNOSIS — H25011 Cortical age-related cataract, right eye: Secondary | ICD-10-CM | POA: Diagnosis not present

## 2015-12-27 DIAGNOSIS — R109 Unspecified abdominal pain: Secondary | ICD-10-CM | POA: Diagnosis not present

## 2016-04-09 DIAGNOSIS — Z1231 Encounter for screening mammogram for malignant neoplasm of breast: Secondary | ICD-10-CM | POA: Diagnosis not present

## 2016-04-09 DIAGNOSIS — Z803 Family history of malignant neoplasm of breast: Secondary | ICD-10-CM | POA: Diagnosis not present

## 2016-05-18 DIAGNOSIS — I10 Essential (primary) hypertension: Secondary | ICD-10-CM | POA: Diagnosis not present

## 2016-05-18 DIAGNOSIS — N390 Urinary tract infection, site not specified: Secondary | ICD-10-CM | POA: Diagnosis not present

## 2016-05-18 DIAGNOSIS — Z79899 Other long term (current) drug therapy: Secondary | ICD-10-CM | POA: Diagnosis not present

## 2016-09-09 DIAGNOSIS — I10 Essential (primary) hypertension: Secondary | ICD-10-CM | POA: Diagnosis not present

## 2016-09-09 DIAGNOSIS — R0989 Other specified symptoms and signs involving the circulatory and respiratory systems: Secondary | ICD-10-CM | POA: Diagnosis not present

## 2016-09-09 DIAGNOSIS — Z136 Encounter for screening for cardiovascular disorders: Secondary | ICD-10-CM | POA: Diagnosis not present

## 2016-09-09 DIAGNOSIS — Z8489 Family history of other specified conditions: Secondary | ICD-10-CM | POA: Diagnosis not present

## 2016-09-09 DIAGNOSIS — R5383 Other fatigue: Secondary | ICD-10-CM | POA: Diagnosis not present

## 2016-10-02 DIAGNOSIS — E876 Hypokalemia: Secondary | ICD-10-CM | POA: Diagnosis not present

## 2016-10-02 DIAGNOSIS — R55 Syncope and collapse: Secondary | ICD-10-CM | POA: Diagnosis not present

## 2016-10-02 DIAGNOSIS — I10 Essential (primary) hypertension: Secondary | ICD-10-CM | POA: Diagnosis not present

## 2016-10-02 DIAGNOSIS — G459 Transient cerebral ischemic attack, unspecified: Secondary | ICD-10-CM | POA: Diagnosis not present

## 2016-10-02 DIAGNOSIS — G9389 Other specified disorders of brain: Secondary | ICD-10-CM | POA: Diagnosis not present

## 2016-10-02 DIAGNOSIS — Z7982 Long term (current) use of aspirin: Secondary | ICD-10-CM | POA: Diagnosis not present

## 2016-10-02 DIAGNOSIS — R739 Hyperglycemia, unspecified: Secondary | ICD-10-CM | POA: Diagnosis not present

## 2016-10-03 DIAGNOSIS — R531 Weakness: Secondary | ICD-10-CM | POA: Diagnosis not present

## 2016-10-03 DIAGNOSIS — I10 Essential (primary) hypertension: Secondary | ICD-10-CM | POA: Diagnosis not present

## 2016-10-03 DIAGNOSIS — R4781 Slurred speech: Secondary | ICD-10-CM | POA: Diagnosis not present

## 2016-10-03 DIAGNOSIS — G459 Transient cerebral ischemic attack, unspecified: Secondary | ICD-10-CM | POA: Diagnosis not present

## 2016-10-03 DIAGNOSIS — E876 Hypokalemia: Secondary | ICD-10-CM | POA: Diagnosis not present

## 2016-10-06 DIAGNOSIS — R0989 Other specified symptoms and signs involving the circulatory and respiratory systems: Secondary | ICD-10-CM | POA: Diagnosis not present

## 2016-10-06 DIAGNOSIS — I10 Essential (primary) hypertension: Secondary | ICD-10-CM | POA: Diagnosis not present

## 2016-10-15 DIAGNOSIS — I10 Essential (primary) hypertension: Secondary | ICD-10-CM | POA: Diagnosis not present

## 2016-10-15 DIAGNOSIS — E782 Mixed hyperlipidemia: Secondary | ICD-10-CM | POA: Diagnosis not present

## 2016-10-15 DIAGNOSIS — G459 Transient cerebral ischemic attack, unspecified: Secondary | ICD-10-CM | POA: Diagnosis not present

## 2016-10-16 DIAGNOSIS — R55 Syncope and collapse: Secondary | ICD-10-CM | POA: Diagnosis not present

## 2016-10-16 DIAGNOSIS — R42 Dizziness and giddiness: Secondary | ICD-10-CM | POA: Diagnosis not present

## 2016-10-31 DIAGNOSIS — I517 Cardiomegaly: Secondary | ICD-10-CM | POA: Diagnosis not present

## 2016-10-31 DIAGNOSIS — I5189 Other ill-defined heart diseases: Secondary | ICD-10-CM | POA: Diagnosis not present

## 2016-10-31 DIAGNOSIS — I340 Nonrheumatic mitral (valve) insufficiency: Secondary | ICD-10-CM | POA: Diagnosis not present

## 2016-10-31 DIAGNOSIS — I361 Nonrheumatic tricuspid (valve) insufficiency: Secondary | ICD-10-CM | POA: Diagnosis not present

## 2016-11-07 DIAGNOSIS — G459 Transient cerebral ischemic attack, unspecified: Secondary | ICD-10-CM | POA: Diagnosis not present

## 2016-11-12 DIAGNOSIS — Z136 Encounter for screening for cardiovascular disorders: Secondary | ICD-10-CM | POA: Diagnosis not present

## 2016-11-12 DIAGNOSIS — G459 Transient cerebral ischemic attack, unspecified: Secondary | ICD-10-CM | POA: Diagnosis not present

## 2016-11-12 DIAGNOSIS — I10 Essential (primary) hypertension: Secondary | ICD-10-CM | POA: Diagnosis not present

## 2016-11-12 DIAGNOSIS — E782 Mixed hyperlipidemia: Secondary | ICD-10-CM | POA: Diagnosis not present

## 2016-12-18 DIAGNOSIS — Z136 Encounter for screening for cardiovascular disorders: Secondary | ICD-10-CM | POA: Diagnosis not present

## 2016-12-18 DIAGNOSIS — G459 Transient cerebral ischemic attack, unspecified: Secondary | ICD-10-CM | POA: Diagnosis not present

## 2017-01-02 DIAGNOSIS — R9439 Abnormal result of other cardiovascular function study: Secondary | ICD-10-CM | POA: Diagnosis not present

## 2017-01-02 DIAGNOSIS — G459 Transient cerebral ischemic attack, unspecified: Secondary | ICD-10-CM | POA: Diagnosis not present

## 2017-01-02 DIAGNOSIS — I10 Essential (primary) hypertension: Secondary | ICD-10-CM | POA: Diagnosis not present

## 2017-01-02 DIAGNOSIS — E782 Mixed hyperlipidemia: Secondary | ICD-10-CM | POA: Diagnosis not present

## 2017-01-21 DIAGNOSIS — M25512 Pain in left shoulder: Secondary | ICD-10-CM | POA: Diagnosis not present

## 2017-02-09 DIAGNOSIS — G459 Transient cerebral ischemic attack, unspecified: Secondary | ICD-10-CM | POA: Diagnosis not present

## 2017-02-09 DIAGNOSIS — E782 Mixed hyperlipidemia: Secondary | ICD-10-CM | POA: Diagnosis not present

## 2017-02-09 DIAGNOSIS — R9439 Abnormal result of other cardiovascular function study: Secondary | ICD-10-CM | POA: Diagnosis not present

## 2017-02-09 DIAGNOSIS — I10 Essential (primary) hypertension: Secondary | ICD-10-CM | POA: Diagnosis not present

## 2017-02-16 DIAGNOSIS — I10 Essential (primary) hypertension: Secondary | ICD-10-CM | POA: Diagnosis not present

## 2017-02-16 DIAGNOSIS — R1084 Generalized abdominal pain: Secondary | ICD-10-CM | POA: Diagnosis not present

## 2017-02-16 DIAGNOSIS — N390 Urinary tract infection, site not specified: Secondary | ICD-10-CM | POA: Diagnosis not present

## 2017-05-21 DIAGNOSIS — Z79899 Other long term (current) drug therapy: Secondary | ICD-10-CM | POA: Diagnosis not present

## 2017-05-21 DIAGNOSIS — I10 Essential (primary) hypertension: Secondary | ICD-10-CM | POA: Diagnosis not present

## 2017-05-21 DIAGNOSIS — R079 Chest pain, unspecified: Secondary | ICD-10-CM | POA: Diagnosis not present

## 2017-05-21 DIAGNOSIS — Z87891 Personal history of nicotine dependence: Secondary | ICD-10-CM | POA: Diagnosis not present

## 2017-05-21 DIAGNOSIS — R55 Syncope and collapse: Secondary | ICD-10-CM | POA: Diagnosis not present

## 2017-05-25 DIAGNOSIS — I10 Essential (primary) hypertension: Secondary | ICD-10-CM | POA: Diagnosis not present

## 2017-05-25 DIAGNOSIS — E876 Hypokalemia: Secondary | ICD-10-CM | POA: Diagnosis not present

## 2017-05-25 DIAGNOSIS — E039 Hypothyroidism, unspecified: Secondary | ICD-10-CM | POA: Diagnosis not present

## 2017-05-25 DIAGNOSIS — R7989 Other specified abnormal findings of blood chemistry: Secondary | ICD-10-CM | POA: Diagnosis not present

## 2017-06-25 DIAGNOSIS — E039 Hypothyroidism, unspecified: Secondary | ICD-10-CM | POA: Diagnosis not present

## 2017-08-12 DIAGNOSIS — R9439 Abnormal result of other cardiovascular function study: Secondary | ICD-10-CM | POA: Diagnosis not present

## 2017-08-12 DIAGNOSIS — I10 Essential (primary) hypertension: Secondary | ICD-10-CM | POA: Diagnosis not present

## 2017-08-12 DIAGNOSIS — G459 Transient cerebral ischemic attack, unspecified: Secondary | ICD-10-CM | POA: Diagnosis not present

## 2017-08-12 DIAGNOSIS — E782 Mixed hyperlipidemia: Secondary | ICD-10-CM | POA: Diagnosis not present

## 2017-08-12 DIAGNOSIS — R55 Syncope and collapse: Secondary | ICD-10-CM | POA: Diagnosis not present

## 2017-08-21 DIAGNOSIS — N390 Urinary tract infection, site not specified: Secondary | ICD-10-CM | POA: Diagnosis not present

## 2017-08-21 DIAGNOSIS — I10 Essential (primary) hypertension: Secondary | ICD-10-CM | POA: Diagnosis not present

## 2017-08-24 DIAGNOSIS — R0602 Shortness of breath: Secondary | ICD-10-CM | POA: Diagnosis not present

## 2017-08-24 DIAGNOSIS — I2510 Atherosclerotic heart disease of native coronary artery without angina pectoris: Secondary | ICD-10-CM | POA: Diagnosis not present

## 2017-08-28 DIAGNOSIS — I081 Rheumatic disorders of both mitral and tricuspid valves: Secondary | ICD-10-CM | POA: Diagnosis not present

## 2017-08-28 DIAGNOSIS — E782 Mixed hyperlipidemia: Secondary | ICD-10-CM | POA: Diagnosis not present

## 2017-08-28 DIAGNOSIS — I10 Essential (primary) hypertension: Secondary | ICD-10-CM | POA: Diagnosis not present

## 2017-08-28 DIAGNOSIS — I119 Hypertensive heart disease without heart failure: Secondary | ICD-10-CM | POA: Diagnosis not present

## 2017-08-28 DIAGNOSIS — I25118 Atherosclerotic heart disease of native coronary artery with other forms of angina pectoris: Secondary | ICD-10-CM | POA: Diagnosis not present

## 2017-08-28 DIAGNOSIS — Z8673 Personal history of transient ischemic attack (TIA), and cerebral infarction without residual deficits: Secondary | ICD-10-CM | POA: Diagnosis not present

## 2017-08-28 DIAGNOSIS — Z7982 Long term (current) use of aspirin: Secondary | ICD-10-CM | POA: Diagnosis not present

## 2017-08-28 DIAGNOSIS — Z79899 Other long term (current) drug therapy: Secondary | ICD-10-CM | POA: Diagnosis not present

## 2017-08-28 DIAGNOSIS — I2584 Coronary atherosclerosis due to calcified coronary lesion: Secondary | ICD-10-CM | POA: Diagnosis not present

## 2017-08-28 DIAGNOSIS — E785 Hyperlipidemia, unspecified: Secondary | ICD-10-CM | POA: Diagnosis not present

## 2017-09-14 DIAGNOSIS — J984 Other disorders of lung: Secondary | ICD-10-CM | POA: Diagnosis not present

## 2017-09-14 DIAGNOSIS — I6523 Occlusion and stenosis of bilateral carotid arteries: Secondary | ICD-10-CM | POA: Diagnosis not present

## 2017-09-14 DIAGNOSIS — I10 Essential (primary) hypertension: Secondary | ICD-10-CM | POA: Diagnosis not present

## 2017-09-14 DIAGNOSIS — Z01818 Encounter for other preprocedural examination: Secondary | ICD-10-CM | POA: Diagnosis not present

## 2017-09-14 DIAGNOSIS — I2510 Atherosclerotic heart disease of native coronary artery without angina pectoris: Secondary | ICD-10-CM | POA: Diagnosis not present

## 2017-09-23 DIAGNOSIS — I10 Essential (primary) hypertension: Secondary | ICD-10-CM | POA: Diagnosis present

## 2017-09-23 DIAGNOSIS — I4891 Unspecified atrial fibrillation: Secondary | ICD-10-CM | POA: Diagnosis not present

## 2017-09-23 DIAGNOSIS — Z951 Presence of aortocoronary bypass graft: Secondary | ICD-10-CM | POA: Diagnosis not present

## 2017-09-23 DIAGNOSIS — Z4682 Encounter for fitting and adjustment of non-vascular catheter: Secondary | ICD-10-CM | POA: Diagnosis not present

## 2017-09-23 DIAGNOSIS — Z452 Encounter for adjustment and management of vascular access device: Secondary | ICD-10-CM | POA: Diagnosis not present

## 2017-09-23 DIAGNOSIS — N390 Urinary tract infection, site not specified: Secondary | ICD-10-CM | POA: Diagnosis present

## 2017-09-23 DIAGNOSIS — Z8249 Family history of ischemic heart disease and other diseases of the circulatory system: Secondary | ICD-10-CM | POA: Diagnosis not present

## 2017-09-23 DIAGNOSIS — R0989 Other specified symptoms and signs involving the circulatory and respiratory systems: Secondary | ICD-10-CM | POA: Diagnosis not present

## 2017-09-23 DIAGNOSIS — I25119 Atherosclerotic heart disease of native coronary artery with unspecified angina pectoris: Secondary | ICD-10-CM | POA: Diagnosis present

## 2017-09-23 DIAGNOSIS — E785 Hyperlipidemia, unspecified: Secondary | ICD-10-CM | POA: Diagnosis present

## 2017-09-23 DIAGNOSIS — Z7982 Long term (current) use of aspirin: Secondary | ICD-10-CM | POA: Diagnosis not present

## 2017-09-23 DIAGNOSIS — J9811 Atelectasis: Secondary | ICD-10-CM | POA: Diagnosis not present

## 2017-09-23 DIAGNOSIS — I340 Nonrheumatic mitral (valve) insufficiency: Secondary | ICD-10-CM | POA: Diagnosis not present

## 2017-09-23 DIAGNOSIS — R079 Chest pain, unspecified: Secondary | ICD-10-CM | POA: Diagnosis not present

## 2017-09-23 DIAGNOSIS — I2510 Atherosclerotic heart disease of native coronary artery without angina pectoris: Secondary | ICD-10-CM | POA: Diagnosis not present

## 2017-09-23 DIAGNOSIS — I77810 Thoracic aortic ectasia: Secondary | ICD-10-CM | POA: Diagnosis not present

## 2017-09-23 DIAGNOSIS — Z888 Allergy status to other drugs, medicaments and biological substances status: Secondary | ICD-10-CM | POA: Diagnosis not present

## 2017-09-23 DIAGNOSIS — Z96653 Presence of artificial knee joint, bilateral: Secondary | ICD-10-CM | POA: Diagnosis present

## 2017-09-23 DIAGNOSIS — I97190 Other postprocedural cardiac functional disturbances following cardiac surgery: Secondary | ICD-10-CM | POA: Diagnosis not present

## 2017-09-23 DIAGNOSIS — R918 Other nonspecific abnormal finding of lung field: Secondary | ICD-10-CM | POA: Diagnosis not present

## 2017-09-23 DIAGNOSIS — J90 Pleural effusion, not elsewhere classified: Secondary | ICD-10-CM | POA: Diagnosis not present

## 2017-09-23 DIAGNOSIS — Z8241 Family history of sudden cardiac death: Secondary | ICD-10-CM | POA: Diagnosis not present

## 2017-09-29 DIAGNOSIS — E785 Hyperlipidemia, unspecified: Secondary | ICD-10-CM | POA: Diagnosis not present

## 2017-09-29 DIAGNOSIS — I4891 Unspecified atrial fibrillation: Secondary | ICD-10-CM | POA: Diagnosis not present

## 2017-09-29 DIAGNOSIS — I081 Rheumatic disorders of both mitral and tricuspid valves: Secondary | ICD-10-CM | POA: Diagnosis not present

## 2017-09-29 DIAGNOSIS — I10 Essential (primary) hypertension: Secondary | ICD-10-CM | POA: Diagnosis not present

## 2017-09-29 DIAGNOSIS — I2510 Atherosclerotic heart disease of native coronary artery without angina pectoris: Secondary | ICD-10-CM | POA: Diagnosis not present

## 2017-09-29 DIAGNOSIS — Z48812 Encounter for surgical aftercare following surgery on the circulatory system: Secondary | ICD-10-CM | POA: Diagnosis not present

## 2017-09-30 DIAGNOSIS — I081 Rheumatic disorders of both mitral and tricuspid valves: Secondary | ICD-10-CM | POA: Diagnosis not present

## 2017-09-30 DIAGNOSIS — Z48812 Encounter for surgical aftercare following surgery on the circulatory system: Secondary | ICD-10-CM | POA: Diagnosis not present

## 2017-09-30 DIAGNOSIS — I4891 Unspecified atrial fibrillation: Secondary | ICD-10-CM | POA: Diagnosis not present

## 2017-09-30 DIAGNOSIS — I2510 Atherosclerotic heart disease of native coronary artery without angina pectoris: Secondary | ICD-10-CM | POA: Diagnosis not present

## 2017-09-30 DIAGNOSIS — I10 Essential (primary) hypertension: Secondary | ICD-10-CM | POA: Diagnosis not present

## 2017-09-30 DIAGNOSIS — E785 Hyperlipidemia, unspecified: Secondary | ICD-10-CM | POA: Diagnosis not present

## 2017-10-01 DIAGNOSIS — I4891 Unspecified atrial fibrillation: Secondary | ICD-10-CM | POA: Diagnosis not present

## 2017-10-01 DIAGNOSIS — I081 Rheumatic disorders of both mitral and tricuspid valves: Secondary | ICD-10-CM | POA: Diagnosis not present

## 2017-10-01 DIAGNOSIS — I10 Essential (primary) hypertension: Secondary | ICD-10-CM | POA: Diagnosis not present

## 2017-10-01 DIAGNOSIS — Z48812 Encounter for surgical aftercare following surgery on the circulatory system: Secondary | ICD-10-CM | POA: Diagnosis not present

## 2017-10-01 DIAGNOSIS — E785 Hyperlipidemia, unspecified: Secondary | ICD-10-CM | POA: Diagnosis not present

## 2017-10-01 DIAGNOSIS — I2510 Atherosclerotic heart disease of native coronary artery without angina pectoris: Secondary | ICD-10-CM | POA: Diagnosis not present

## 2017-10-03 DIAGNOSIS — I2510 Atherosclerotic heart disease of native coronary artery without angina pectoris: Secondary | ICD-10-CM | POA: Diagnosis not present

## 2017-10-03 DIAGNOSIS — Z48812 Encounter for surgical aftercare following surgery on the circulatory system: Secondary | ICD-10-CM | POA: Diagnosis not present

## 2017-10-03 DIAGNOSIS — I081 Rheumatic disorders of both mitral and tricuspid valves: Secondary | ICD-10-CM | POA: Diagnosis not present

## 2017-10-03 DIAGNOSIS — I10 Essential (primary) hypertension: Secondary | ICD-10-CM | POA: Diagnosis not present

## 2017-10-03 DIAGNOSIS — I4891 Unspecified atrial fibrillation: Secondary | ICD-10-CM | POA: Diagnosis not present

## 2017-10-03 DIAGNOSIS — E785 Hyperlipidemia, unspecified: Secondary | ICD-10-CM | POA: Diagnosis not present

## 2017-10-05 DIAGNOSIS — I10 Essential (primary) hypertension: Secondary | ICD-10-CM | POA: Diagnosis not present

## 2017-10-05 DIAGNOSIS — Z48812 Encounter for surgical aftercare following surgery on the circulatory system: Secondary | ICD-10-CM | POA: Diagnosis not present

## 2017-10-05 DIAGNOSIS — I4891 Unspecified atrial fibrillation: Secondary | ICD-10-CM | POA: Diagnosis not present

## 2017-10-05 DIAGNOSIS — I2510 Atherosclerotic heart disease of native coronary artery without angina pectoris: Secondary | ICD-10-CM | POA: Diagnosis not present

## 2017-10-05 DIAGNOSIS — I081 Rheumatic disorders of both mitral and tricuspid valves: Secondary | ICD-10-CM | POA: Diagnosis not present

## 2017-10-05 DIAGNOSIS — E785 Hyperlipidemia, unspecified: Secondary | ICD-10-CM | POA: Diagnosis not present

## 2017-10-07 DIAGNOSIS — Z951 Presence of aortocoronary bypass graft: Secondary | ICD-10-CM | POA: Diagnosis not present

## 2017-10-07 DIAGNOSIS — J9811 Atelectasis: Secondary | ICD-10-CM | POA: Diagnosis not present

## 2017-10-07 DIAGNOSIS — I2510 Atherosclerotic heart disease of native coronary artery without angina pectoris: Secondary | ICD-10-CM | POA: Diagnosis not present

## 2017-10-07 DIAGNOSIS — J90 Pleural effusion, not elsewhere classified: Secondary | ICD-10-CM | POA: Diagnosis not present

## 2017-10-07 DIAGNOSIS — Z9889 Other specified postprocedural states: Secondary | ICD-10-CM | POA: Diagnosis not present

## 2017-10-08 DIAGNOSIS — I10 Essential (primary) hypertension: Secondary | ICD-10-CM | POA: Diagnosis not present

## 2017-10-08 DIAGNOSIS — I081 Rheumatic disorders of both mitral and tricuspid valves: Secondary | ICD-10-CM | POA: Diagnosis not present

## 2017-10-08 DIAGNOSIS — Z48812 Encounter for surgical aftercare following surgery on the circulatory system: Secondary | ICD-10-CM | POA: Diagnosis not present

## 2017-10-08 DIAGNOSIS — E785 Hyperlipidemia, unspecified: Secondary | ICD-10-CM | POA: Diagnosis not present

## 2017-10-08 DIAGNOSIS — I4891 Unspecified atrial fibrillation: Secondary | ICD-10-CM | POA: Diagnosis not present

## 2017-10-08 DIAGNOSIS — I2510 Atherosclerotic heart disease of native coronary artery without angina pectoris: Secondary | ICD-10-CM | POA: Diagnosis not present

## 2017-10-12 DIAGNOSIS — Z951 Presence of aortocoronary bypass graft: Secondary | ICD-10-CM | POA: Diagnosis not present

## 2017-10-12 DIAGNOSIS — I480 Paroxysmal atrial fibrillation: Secondary | ICD-10-CM | POA: Diagnosis not present

## 2017-10-12 DIAGNOSIS — I10 Essential (primary) hypertension: Secondary | ICD-10-CM | POA: Diagnosis not present

## 2017-10-13 DIAGNOSIS — I4891 Unspecified atrial fibrillation: Secondary | ICD-10-CM | POA: Diagnosis not present

## 2017-10-13 DIAGNOSIS — E785 Hyperlipidemia, unspecified: Secondary | ICD-10-CM | POA: Diagnosis not present

## 2017-10-13 DIAGNOSIS — I2510 Atherosclerotic heart disease of native coronary artery without angina pectoris: Secondary | ICD-10-CM | POA: Diagnosis not present

## 2017-10-13 DIAGNOSIS — I10 Essential (primary) hypertension: Secondary | ICD-10-CM | POA: Diagnosis not present

## 2017-10-13 DIAGNOSIS — Z48812 Encounter for surgical aftercare following surgery on the circulatory system: Secondary | ICD-10-CM | POA: Diagnosis not present

## 2017-10-13 DIAGNOSIS — I081 Rheumatic disorders of both mitral and tricuspid valves: Secondary | ICD-10-CM | POA: Diagnosis not present

## 2017-10-16 DIAGNOSIS — I081 Rheumatic disorders of both mitral and tricuspid valves: Secondary | ICD-10-CM | POA: Diagnosis not present

## 2017-10-16 DIAGNOSIS — I10 Essential (primary) hypertension: Secondary | ICD-10-CM | POA: Diagnosis not present

## 2017-10-16 DIAGNOSIS — I2510 Atherosclerotic heart disease of native coronary artery without angina pectoris: Secondary | ICD-10-CM | POA: Diagnosis not present

## 2017-10-16 DIAGNOSIS — Z48812 Encounter for surgical aftercare following surgery on the circulatory system: Secondary | ICD-10-CM | POA: Diagnosis not present

## 2017-10-16 DIAGNOSIS — I4891 Unspecified atrial fibrillation: Secondary | ICD-10-CM | POA: Diagnosis not present

## 2017-10-16 DIAGNOSIS — E785 Hyperlipidemia, unspecified: Secondary | ICD-10-CM | POA: Diagnosis not present

## 2017-10-21 DIAGNOSIS — E785 Hyperlipidemia, unspecified: Secondary | ICD-10-CM | POA: Diagnosis not present

## 2017-10-21 DIAGNOSIS — Z48812 Encounter for surgical aftercare following surgery on the circulatory system: Secondary | ICD-10-CM | POA: Diagnosis not present

## 2017-10-21 DIAGNOSIS — I081 Rheumatic disorders of both mitral and tricuspid valves: Secondary | ICD-10-CM | POA: Diagnosis not present

## 2017-10-21 DIAGNOSIS — I2510 Atherosclerotic heart disease of native coronary artery without angina pectoris: Secondary | ICD-10-CM | POA: Diagnosis not present

## 2017-10-21 DIAGNOSIS — Z951 Presence of aortocoronary bypass graft: Secondary | ICD-10-CM | POA: Diagnosis not present

## 2017-10-21 DIAGNOSIS — I4891 Unspecified atrial fibrillation: Secondary | ICD-10-CM | POA: Diagnosis not present

## 2017-10-21 DIAGNOSIS — I10 Essential (primary) hypertension: Secondary | ICD-10-CM | POA: Diagnosis not present

## 2017-10-28 DIAGNOSIS — I4891 Unspecified atrial fibrillation: Secondary | ICD-10-CM | POA: Diagnosis not present

## 2017-10-28 DIAGNOSIS — I10 Essential (primary) hypertension: Secondary | ICD-10-CM | POA: Diagnosis not present

## 2017-10-28 DIAGNOSIS — I2510 Atherosclerotic heart disease of native coronary artery without angina pectoris: Secondary | ICD-10-CM | POA: Diagnosis not present

## 2017-10-28 DIAGNOSIS — Z48812 Encounter for surgical aftercare following surgery on the circulatory system: Secondary | ICD-10-CM | POA: Diagnosis not present

## 2017-10-28 DIAGNOSIS — I081 Rheumatic disorders of both mitral and tricuspid valves: Secondary | ICD-10-CM | POA: Diagnosis not present

## 2017-10-28 DIAGNOSIS — E785 Hyperlipidemia, unspecified: Secondary | ICD-10-CM | POA: Diagnosis not present

## 2017-11-04 DIAGNOSIS — I2510 Atherosclerotic heart disease of native coronary artery without angina pectoris: Secondary | ICD-10-CM | POA: Diagnosis not present

## 2017-11-04 DIAGNOSIS — Z951 Presence of aortocoronary bypass graft: Secondary | ICD-10-CM | POA: Diagnosis not present

## 2017-11-09 DIAGNOSIS — E785 Hyperlipidemia, unspecified: Secondary | ICD-10-CM | POA: Diagnosis not present

## 2017-11-09 DIAGNOSIS — I4891 Unspecified atrial fibrillation: Secondary | ICD-10-CM | POA: Diagnosis not present

## 2017-11-09 DIAGNOSIS — I10 Essential (primary) hypertension: Secondary | ICD-10-CM | POA: Diagnosis not present

## 2017-11-09 DIAGNOSIS — I25810 Atherosclerosis of coronary artery bypass graft(s) without angina pectoris: Secondary | ICD-10-CM | POA: Diagnosis not present

## 2017-11-19 DIAGNOSIS — I2510 Atherosclerotic heart disease of native coronary artery without angina pectoris: Secondary | ICD-10-CM | POA: Diagnosis not present

## 2017-11-19 DIAGNOSIS — Z951 Presence of aortocoronary bypass graft: Secondary | ICD-10-CM | POA: Diagnosis not present

## 2017-11-23 DIAGNOSIS — Z951 Presence of aortocoronary bypass graft: Secondary | ICD-10-CM | POA: Diagnosis not present

## 2017-11-23 DIAGNOSIS — I2510 Atherosclerotic heart disease of native coronary artery without angina pectoris: Secondary | ICD-10-CM | POA: Diagnosis not present
